# Patient Record
Sex: Female | Born: 1990 | Race: White | Hispanic: No | Marital: Married | Smoking: Never smoker
Health system: Southern US, Community
[De-identification: ages and names within clinical notes are randomized; demographics above are authoritative.]

## PROBLEM LIST (undated history)

## (undated) ENCOUNTER — Inpatient Hospital Stay (HOSPITAL_COMMUNITY): Payer: Self-pay

## (undated) ENCOUNTER — Inpatient Hospital Stay: Payer: Self-pay

## (undated) DIAGNOSIS — O169 Unspecified maternal hypertension, unspecified trimester: Secondary | ICD-10-CM

## (undated) DIAGNOSIS — D62 Acute posthemorrhagic anemia: Secondary | ICD-10-CM

## (undated) DIAGNOSIS — Z98891 History of uterine scar from previous surgery: Secondary | ICD-10-CM

## (undated) HISTORY — PX: WISDOM TOOTH EXTRACTION: SHX21

## (undated) HISTORY — PX: CHOLECYSTECTOMY: SHX55

---
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
## 2012-10-28 LAB — OB RESULTS CONSOLE HEPATITIS B SURFACE ANTIGEN: Hepatitis B Surface Ag: NEGATIVE

## 2012-10-28 LAB — OB RESULTS CONSOLE HGB/HCT, BLOOD: Hemoglobin: 13.8 g/dL

## 2012-10-28 LAB — OB RESULTS CONSOLE ABO/RH: RH Type: POSITIVE

## 2012-10-28 LAB — OB RESULTS CONSOLE HIV ANTIBODY (ROUTINE TESTING): HIV: NONREACTIVE

## 2012-10-28 LAB — OB RESULTS CONSOLE RUBELLA ANTIBODY, IGM: Rubella: IMMUNE

## 2013-01-20 LAB — OB RESULTS CONSOLE HGB/HCT, BLOOD: HCT: 37 %

## 2013-01-20 LAB — OB RESULTS CONSOLE RPR: RPR: NONREACTIVE

## 2013-03-13 ENCOUNTER — Inpatient Hospital Stay (HOSPITAL_COMMUNITY)
Admission: AD | Admit: 2013-03-13 | Discharge: 2013-03-13 | Disposition: A | Discharge status: Home / Self Care | Payer: Managed Care, Other (non HMO) | Source: Ambulatory Visit | Attending: Obstetrics & Gynecology | Admitting: Obstetrics & Gynecology

## 2013-03-13 ENCOUNTER — Encounter (HOSPITAL_COMMUNITY): Payer: Self-pay | Admitting: *Deleted

## 2013-03-13 DIAGNOSIS — R030 Elevated blood-pressure reading, without diagnosis of hypertension: Secondary | ICD-10-CM | POA: Insufficient documentation

## 2013-03-13 DIAGNOSIS — O99891 Other specified diseases and conditions complicating pregnancy: Secondary | ICD-10-CM | POA: Insufficient documentation

## 2013-03-13 DIAGNOSIS — O26893 Other specified pregnancy related conditions, third trimester: Secondary | ICD-10-CM

## 2013-03-13 DIAGNOSIS — R0989 Other specified symptoms and signs involving the circulatory and respiratory systems: Secondary | ICD-10-CM

## 2013-03-13 DIAGNOSIS — R51 Headache: Secondary | ICD-10-CM | POA: Insufficient documentation

## 2013-03-13 LAB — CBC
HCT: 35.6 % — ABNORMAL LOW (ref 36.0–46.0)
Hemoglobin: 11.8 g/dL — ABNORMAL LOW (ref 12.0–15.0)
MCH: 28.2 pg (ref 26.0–34.0)
MCHC: 33.1 g/dL (ref 30.0–36.0)
MCV: 85.2 fL (ref 78.0–100.0)
Platelets: 248 10*3/uL (ref 150–400)
RBC: 4.18 MIL/uL (ref 3.87–5.11)
RDW: 12.4 % (ref 11.5–15.5)
WBC: 10.6 10*3/uL — ABNORMAL HIGH (ref 4.0–10.5)

## 2013-03-13 LAB — URINALYSIS, ROUTINE W REFLEX MICROSCOPIC
Bilirubin Urine: NEGATIVE
Glucose, UA: NEGATIVE mg/dL
Hgb urine dipstick: NEGATIVE
Ketones, ur: NEGATIVE mg/dL
Leukocytes, UA: NEGATIVE
Nitrite: NEGATIVE
Protein, ur: NEGATIVE mg/dL
Specific Gravity, Urine: 1.025 (ref 1.005–1.030)
Urobilinogen, UA: 0.2 mg/dL (ref 0.0–1.0)
pH: 6.5 (ref 5.0–8.0)

## 2013-03-13 LAB — URIC ACID: Uric Acid, Serum: 4.8 mg/dL (ref 2.4–7.0)

## 2013-03-13 LAB — COMPREHENSIVE METABOLIC PANEL
ALT: 19 U/L (ref 0–35)
AST: 20 U/L (ref 0–37)
Albumin: 2.7 g/dL — ABNORMAL LOW (ref 3.5–5.2)
Alkaline Phosphatase: 181 U/L — ABNORMAL HIGH (ref 39–117)
BUN: 7 mg/dL (ref 6–23)
CO2: 24 mEq/L (ref 19–32)
Calcium: 9.1 mg/dL (ref 8.4–10.5)
Chloride: 103 mEq/L (ref 96–112)
Creatinine, Ser: 0.68 mg/dL (ref 0.50–1.10)
GFR calc Af Amer: >90 mL/min (ref >90)
GFR calc non Af Amer: >90 mL/min (ref >90)
Glucose, Bld: 83 mg/dL (ref 70–99)
Potassium: 4.4 mEq/L (ref 3.5–5.1)
Sodium: 135 mEq/L (ref 135–145)
Total Bilirubin: 0.2 mg/dL — ABNORMAL LOW (ref 0.3–1.2)
Total Protein: 6.4 g/dL (ref 6.0–8.3)

## 2013-03-13 MED ORDER — NALBUPHINE HCL 10 MG/ML IJ SOLN
5.0000 mg | INTRAMUSCULAR | Status: AC
  Administered 2013-03-13: 5 mg via SUBCUTANEOUS
  Filled 2013-03-13: qty 0.5

## 2013-03-13 NOTE — MAU Note (Signed)
Today had headache and went to [facility] and b/p was 155 95.

## 2013-03-13 NOTE — MAU Provider Note (Signed)
  History     CSN: [id number]  Arrival date and time: 03/13/13 2120 Orders placed on chart at 2125 Call from nurse @ 2205 Provider in to evaluate patient @ 2220   HPI  headache all day - unresponsive to Tylenol reports elevated BP 160/94 at pharmacy BP monitor persistent edema in legs for over a week - no worse but no better no vision changes no nausea or vomiting / no epigastric pain   No past medical history on file.  No past surgical history on file.  No family history on file.  History  Substance Use Topics  . Smoking status: Not on file  . Smokeless tobacco: Not on file  . Alcohol Use: Not on file    Allergies: Allergies not on file  No prescriptions prior to admission    ROS Physical Exam   VS: 98.4 - 94 - 20 - 132/84 Serial BPs: 132/84  130/79  129/93   126/83  Physical Exam  Alert and oriented x 3 Heart RRR Lungs clear Abdomen soft / non-tender / uterus gravid and non-tender Defer pelvic exam / VE Extremities mild dependent edema - trace / DTR 1+ no clonus  MAU Course  Procedures  NST - reactive  PIH labs: normal range  creatinine   0.68  BUN  Potassium  4.4  SGOT   20  SGPT    19  Alk Phos   181  uric acid  4.8  glucose 85    hgb  11.8  hct   35.6  plt    248   urine - negative protein / spec gravity @ 1.025  Assessment and Plan  34.6 weeks with headache Labile BP - mild hypertension c/w possible developing PIH Discussed risk for hypertensive disorders of pregnancy - adolescent /excessive weight gain /family HX No evidence PEC today - reviewed evaluation today does not rule out PEC prior to delivery - plan to continue to monitor closely with additional ROB visit this week   1) DC home 2) headache: nubain 5mg  subQ for headache and rest 3) dependent edema: elevate extremities / increase water / reduce sodium and fast foods 4) reviewed signs for PEC to call                    persistent headache / worsening swelling / blurred  vision / epigastric pain  Follow-up at [facility] this week for re-evaluation                    sono scheduled for 36 weeks (next visit in 2 weeks)     Kendra Hunter 03/13/2013, 9:33 PM

## 2013-03-16 LAB — OB RESULTS CONSOLE GBS: GBS: NEGATIVE

## 2013-03-29 ENCOUNTER — Encounter (HOSPITAL_COMMUNITY): Payer: Self-pay | Admitting: *Deleted

## 2013-03-29 ENCOUNTER — Inpatient Hospital Stay (HOSPITAL_COMMUNITY)
Admission: AD | Admit: 2013-03-29 | Discharge: 2013-04-02 | DRG: 765 | Disposition: A | Discharge status: Home / Self Care | Payer: Managed Care, Other (non HMO) | Source: Ambulatory Visit | Attending: Obstetrics & Gynecology | Admitting: Obstetrics & Gynecology

## 2013-03-29 DIAGNOSIS — O324XX0 Maternal care for high head at term, not applicable or unspecified: Secondary | ICD-10-CM | POA: Diagnosis present

## 2013-03-29 DIAGNOSIS — D62 Acute posthemorrhagic anemia: Secondary | ICD-10-CM

## 2013-03-29 DIAGNOSIS — O9903 Anemia complicating the puerperium: Secondary | ICD-10-CM | POA: Diagnosis not present

## 2013-03-29 DIAGNOSIS — O169 Unspecified maternal hypertension, unspecified trimester: Secondary | ICD-10-CM

## 2013-03-29 DIAGNOSIS — O139 Gestational [pregnancy-induced] hypertension without significant proteinuria, unspecified trimester: Principal | ICD-10-CM | POA: Diagnosis present

## 2013-03-29 DIAGNOSIS — Z98891 History of uterine scar from previous surgery: Secondary | ICD-10-CM

## 2013-03-29 HISTORY — DX: Unspecified maternal hypertension, unspecified trimester: O16.9

## 2013-03-29 HISTORY — DX: History of uterine scar from previous surgery: Z98.891

## 2013-03-29 HISTORY — DX: Acute posthemorrhagic anemia: D62

## 2013-03-29 LAB — CBC
HCT: 34 % — ABNORMAL LOW (ref 36.0–46.0)
Hemoglobin: 11.4 g/dL — ABNORMAL LOW (ref 12.0–15.0)
MCHC: 33.5 g/dL (ref 30.0–36.0)
MCV: 82.5 fL (ref 78.0–100.0)
Platelets: 251 10*3/uL (ref 150–400)
RBC: 4.12 MIL/uL (ref 3.87–5.11)
WBC: 10.6 10*3/uL — ABNORMAL HIGH (ref 4.0–10.5)

## 2013-03-29 LAB — COMPREHENSIVE METABOLIC PANEL
AST: 15 U/L (ref 0–37)
Albumin: 2.6 g/dL — ABNORMAL LOW (ref 3.5–5.2)
Alkaline Phosphatase: 185 U/L — ABNORMAL HIGH (ref 39–117)
CO2: 21 mEq/L (ref 19–32)
Calcium: 8.8 mg/dL (ref 8.4–10.5)
Creatinine, Ser: 0.63 mg/dL (ref 0.50–1.10)
GFR calc non Af Amer: >90 mL/min (ref >90)
Glucose, Bld: 90 mg/dL (ref 70–99)
Potassium: 4 mEq/L (ref 3.5–5.1)
Sodium: 136 mEq/L (ref 135–145)
Total Protein: 6.1 g/dL (ref 6.0–8.3)

## 2013-03-29 LAB — PROTEIN / CREATININE RATIO, URINE
Creatinine, Urine: 34.22 mg/dL
Protein Creatinine Ratio: 0.14 (ref 0.00–0.15)
Total Protein, Urine: 4.7 mg/dL

## 2013-03-29 LAB — TYPE AND SCREEN
ABO/RH(D): O POS
Antibody Screen: NEGATIVE

## 2013-03-29 LAB — LACTATE DEHYDROGENASE: LDH: 212 U/L (ref 94–250)

## 2013-03-29 LAB — RPR: RPR Ser Ql: NONREACTIVE

## 2013-03-29 MED ORDER — ACETAMINOPHEN 325 MG PO TABS: 650.0000 mg | ORAL_TABLET | ORAL | Status: DC | PRN

## 2013-03-29 MED ORDER — ZOLPIDEM TARTRATE 5 MG PO TABS
5.0000 mg | ORAL_TABLET | Freq: Every evening | ORAL | Status: DC | PRN
  Administered 2013-03-29: 5 mg via ORAL
  Filled 2013-03-29: qty 1

## 2013-03-29 MED ORDER — OXYTOCIN BOLUS FROM INFUSION: 500.0000 mL | INTRAVENOUS | Status: DC

## 2013-03-29 MED ORDER — LACTATED RINGERS IV SOLN: 500.0000 mL | Freq: Once | INTRAVENOUS | Status: DC

## 2013-03-29 MED ORDER — PHENYLEPHRINE 40 MCG/ML (10ML) SYRINGE FOR IV PUSH (FOR BLOOD PRESSURE SUPPORT): 80.0000 ug | PREFILLED_SYRINGE | INTRAVENOUS | Status: DC | PRN

## 2013-03-29 MED ORDER — PHENYLEPHRINE 40 MCG/ML (10ML) SYRINGE FOR IV PUSH (FOR BLOOD PRESSURE SUPPORT)
80.0000 ug | PREFILLED_SYRINGE | INTRAVENOUS | Status: DC | PRN
  Filled 2013-03-29: qty 10

## 2013-03-29 MED ORDER — LACTATED RINGERS IV SOLN
INTRAVENOUS | Status: DC
  Administered 2013-03-29 (×2): 125 mL/h via INTRAVENOUS
  Administered 2013-03-30 (×4): via INTRAVENOUS

## 2013-03-29 MED ORDER — OXYCODONE-ACETAMINOPHEN 5-325 MG PO TABS: 1.0000 | ORAL_TABLET | ORAL | Status: DC | PRN

## 2013-03-29 MED ORDER — CITRIC ACID-SODIUM CITRATE 334-500 MG/5ML PO SOLN
30.0000 mL | ORAL | Status: DC | PRN
  Administered 2013-03-30: 30 mL via ORAL
  Filled 2013-03-29: qty 15

## 2013-03-29 MED ORDER — TERBUTALINE SULFATE 1 MG/ML IJ SOLN: 0.2500 mg | Freq: Once | INTRAMUSCULAR | Status: AC | PRN

## 2013-03-29 MED ORDER — OXYTOCIN 40 UNITS IN LACTATED RINGERS INFUSION - SIMPLE MED: 62.5000 mL/h | INTRAVENOUS | Status: DC

## 2013-03-29 MED ORDER — LACTATED RINGERS IV SOLN
500.0000 mL | INTRAVENOUS | Status: DC | PRN
  Administered 2013-03-29: 300 mL via INTRAVENOUS
  Administered 2013-03-29: 500 mL via INTRAVENOUS

## 2013-03-29 MED ORDER — FENTANYL 2.5 MCG/ML BUPIVACAINE 1/10 % EPIDURAL INFUSION (WH - ANES)
14.0000 mL/h | INTRAMUSCULAR | Status: DC | PRN
  Administered 2013-03-30: 14 mL/h via EPIDURAL
  Filled 2013-03-29 (×2): qty 125

## 2013-03-29 MED ORDER — LIDOCAINE HCL (PF) 1 % IJ SOLN
30.0000 mL | INTRAMUSCULAR | Status: DC | PRN
  Filled 2013-03-29: qty 30

## 2013-03-29 MED ORDER — EPHEDRINE 5 MG/ML INJ: 10.0000 mg | INTRAVENOUS | Status: DC | PRN

## 2013-03-29 MED ORDER — ONDANSETRON HCL 4 MG/2ML IJ SOLN: 4.0000 mg | Freq: Four times a day (QID) | INTRAMUSCULAR | Status: DC | PRN

## 2013-03-29 MED ORDER — DIPHENHYDRAMINE HCL 50 MG/ML IJ SOLN: 12.5000 mg | INTRAMUSCULAR | Status: DC | PRN

## 2013-03-29 MED ORDER — MISOPROSTOL 25 MCG QUARTER TABLET
25.0000 ug | ORAL_TABLET | ORAL | Status: DC | PRN
  Administered 2013-03-29: 25 ug via VAGINAL
  Filled 2013-03-29: qty 0.25

## 2013-03-29 MED ORDER — IBUPROFEN 600 MG PO TABS: 600.0000 mg | ORAL_TABLET | Freq: Four times a day (QID) | ORAL | Status: DC | PRN

## 2013-03-29 MED ORDER — BUTORPHANOL TARTRATE 1 MG/ML IJ SOLN
1.0000 mg | INTRAMUSCULAR | Status: DC | PRN
  Administered 2013-03-29 – 2013-03-30 (×2): 1 mg via INTRAVENOUS
  Filled 2013-03-29 (×2): qty 1

## 2013-03-29 MED ORDER — EPHEDRINE 5 MG/ML INJ
10.0000 mg | INTRAVENOUS | Status: DC | PRN
  Filled 2013-03-29: qty 4

## 2013-03-29 NOTE — Progress Notes (Signed)
S: Doing well, no complaints.  Pt contracting q 1-3, notes discomfort in back, since cytotec placed. Would like something for pain. IOL for gest htn w/ HA. Pt notes HA now gone.  O: BP 127/57  Pulse 67  Temp(Src) 97.9 F (36.6 C) (Oral)  Resp 18  Ht 5\' 5"  (1.651 m)  Wt 100.699 kg (222 lb)  BMI 36.94 kg/m2 Gen: well appearing, no distress Abd: soft, gravid, NT, no RUQ pain LE: 2+ DTR, no clonus, 1+ edema   FHT:  FHR: 130's bpm, variability: moderate,  accelerations:  Present,  decelerations:  Absent UC:   regular, every 1 minutes SVE:   Dilation: 1.5 Effacement (%): 60 Station: -2 Exam by:: K.Forsell,RNC Cervical foley placed. SVE done, exam confirmed, vtx confirmed. Foley placement d/w pt and family. Foley bulb threaded through cvx, bulb inflated to 60 cc. Pt tolerated well. Within 5 minutes of placement, brisk vaginal bleeding noted, about 200 cc. Pt notes continued back cramping, no worse than prior to foley placement.    A / P:  22 y.o.  Obstetric History   G1   P0   T0   P0   A0   TAB0   SAB0   E0   M0   L0    at [redacted]w[redacted]d IOL due to gestational htn w/ HA, no lab or other exam evidence of PEC. - current vaginal bleeding. Likely due to foley placement. plts 200+. IV in place, T/S up to date. Will continue to monitor closely at this time. Given htn, abruption in DDx but abdomen soft, fetal testing reasurring.   Fetal Wellbeing:  Category I Pain Control:  will give 1mg  IV Stadol and ambien during cervical ripening  Anticipated MOD:  NSVD  Tiffinie Caillier A. 03/29/2013, 9:24 PM

## 2013-03-29 NOTE — H&P (Signed)
Kendra Hunter is a 22 y.o. female presenting for labor induction at 37 wks for worsening BPs (150/98, 130/98 in office) and HA in patient with GHTN that was noted at 33 wks.   Ob care at [facility], primary Ob Dr Juliene Pina, transferred care from [facility] at 15 wks. Nomal labs, Rh positive, passed glucose screen. Last growth sono on 11/3 7'1" at 88% and AC at 98%, nl AFI, VTX.   History OB History   Grav Para Term Preterm Abortions TAB SAB Ect Mult Living   1              History reviewed. No pertinent past medical history. Past Surgical History  Procedure Laterality Date  . Wisdom tooth extraction     Family History: family history is not on file. Social History:  reports that she has never smoked. She does not have any smokeless tobacco history on file. She reports that she does not drink alcohol or use illicit drugs.   Prenatal Transfer Tool  Maternal Diabetes: No Genetic Screening: Normal  QUAD scr neg, CF screen neg  Maternal Ultrasounds/Referrals: Normal Fetal Ultrasounds or other Referrals:  None Maternal Substance Abuse:  No Significant Maternal Medications:  None Significant Maternal Lab Results:  Lab values include: Group B Strep negative Other Comments:  Gestational HTN, PIH labs normal. FOB's sister has unilateral small kidney  Review of Systems  Constitutional: Negative for fever.  Respiratory: Negative for shortness of breath.   Cardiovascular: Negative for chest pain.  Skin: Negative for rash.  Neurological: Positive for headaches. Negative for dizziness.  Psychiatric/Behavioral: Negative for depression.    Dilation: 1 Effacement (%): 50 Station: -2 Exam by:: Renaldo Harrison, RN Blood pressure 143/84, pulse 78, temperature 99.1 F (37.3 C), temperature source Axillary, resp. rate 20, height 5\' 5"  (1.651 m), weight 222 lb (100.699 kg). Exam Physical Exam  A&O x 3, no acute distress. Pleasant HEENT neg Lungs CTA bilat CV RRR, S1S2 normal Abdo soft, non tender, non  acute Extr no edema/ tenderness.DTR +3/+3  Pelvic 1/50%/-3/VTX, intact. Pelvis gynecoid FHT 135 Toco none palpated  Prenatal labs: ABO, Rh: --/--/O POS, O POS (11/10 1210) Antibody: NEG (11/10 1210) Rubella: Immune (06/11 0000) RPR: Nonreactive (09/03 0000)  HBsAg: Negative (06/11 0000)  HIV: Non-reactive (06/11 0000)  GBS: Negative (10/28 0000)  Glucola - passed 3 hr GTT QUAD neg  PIH labs CF screening neg   Assessment/Plan: 22 yo, G1 at 37.1 wks with gestational HTN and worsening BPs with HAs. Admitted for labor induction. PIH labs, urine P/C ratio to decide on magnesium prophylaxis. IOL with Cytotec per protocol as tolerated. BP management as needed if persistently elevated. GBS neg. Plan reviewed with patient and her mother, 37 wk IOL due to elevated BP 158/98 in office.    Aravind Chrismer R 03/29/2013, 4:52 PM

## 2013-03-29 NOTE — Progress Notes (Signed)
CTSP for bleeding  Bleeding started w/ insertion of cervical foley. 200 cc EBL, balloon deflated to 30cc by RN. Bleeding stopped but pt just now up to bathroom and about 60 cc blood PV. Pt notes continued back pain, cramping. Has not yet received pain meds but would like something for pain.  O: BP 118/54  Pulse 79  Temp(Src) 98.7 F (37.1 C) (Oral)  Resp 18  Ht 5\' 5"  (1.651 m)  Wt 100.699 kg (222 lb)  BMI 36.94 kg/m2   FHT:  FHR: 140s bpm, variability: moderate,  accelerations:  Present,  decelerations:  Absent UC:   regular, every 1 minutes cvx 2.5 cm, foley pulled through cvx, small clot. No additional bleeding. 50%/ vtx -2   A / P:  22 y.o.  Obstetric History   G1   P0   T0   P0   A0   TAB0   SAB0   E0   M0   L0    at [redacted]w[redacted]d IOL for gestational htn Pt probably entering more active labor. Bleeding probably from cervical dilation. Reacitve fetal testing, stable maternal status, continue close observation. Expectant management. Stadol for pain. OK for Ambien to help sleep. If not having continued ctx and cervical change overnight will start pitocin and plan AROM.   Fetal Wellbeing:  Category I Pain Control:  Stadol now  Anticipated MOD:  NSVD  Kendra Pensyl A. 03/29/2013, 10:29 PM

## 2013-03-30 ENCOUNTER — Inpatient Hospital Stay (HOSPITAL_COMMUNITY): Payer: Managed Care, Other (non HMO) | Admitting: Anesthesiology

## 2013-03-30 ENCOUNTER — Encounter (HOSPITAL_COMMUNITY): Payer: Managed Care, Other (non HMO) | Admitting: Anesthesiology

## 2013-03-30 ENCOUNTER — Encounter (HOSPITAL_COMMUNITY)
Admission: AD | Disposition: A | Discharge status: Home / Self Care | Payer: Self-pay | Source: Ambulatory Visit | Attending: Obstetrics & Gynecology

## 2013-03-30 ENCOUNTER — Encounter (HOSPITAL_COMMUNITY): Payer: Self-pay | Admitting: Anesthesiology

## 2013-03-30 LAB — CBC
HCT: 31.5 % — ABNORMAL LOW (ref 36.0–46.0)
Hemoglobin: 10.6 g/dL — ABNORMAL LOW (ref 12.0–15.0)
MCH: 28 pg (ref 26.0–34.0)
MCV: 83.1 fL (ref 78.0–100.0)
Platelets: 212 10*3/uL (ref 150–400)
RBC: 3.79 MIL/uL — ABNORMAL LOW (ref 3.87–5.11)
WBC: 13.9 10*3/uL — ABNORMAL HIGH (ref 4.0–10.5)

## 2013-03-30 SURGERY — Surgical Case
Anesthesia: Epidural | Site: Abdomen | Wound class: Clean Contaminated

## 2013-03-30 MED ORDER — SODIUM BICARBONATE 8.4 % IV SOLN
INTRAVENOUS | Status: AC
  Filled 2013-03-30: qty 50

## 2013-03-30 MED ORDER — ONDANSETRON HCL 4 MG/2ML IJ SOLN
INTRAMUSCULAR | Status: DC | PRN
  Administered 2013-03-30: 4 mg via INTRAVENOUS

## 2013-03-30 MED ORDER — 0.9 % SODIUM CHLORIDE (POUR BTL) OPTIME
TOPICAL | Status: DC | PRN
  Administered 2013-03-30: 1000 mL

## 2013-03-30 MED ORDER — MORPHINE SULFATE (PF) 0.5 MG/ML IJ SOLN
INTRAMUSCULAR | Status: DC | PRN
  Administered 2013-03-30: 4 mg via EPIDURAL

## 2013-03-30 MED ORDER — FENTANYL CITRATE 0.05 MG/ML IJ SOLN
INTRAMUSCULAR | Status: AC
  Filled 2013-03-30: qty 2

## 2013-03-30 MED ORDER — MEPERIDINE HCL 25 MG/ML IJ SOLN
INTRAMUSCULAR | Status: AC
  Filled 2013-03-30: qty 1

## 2013-03-30 MED ORDER — OXYTOCIN 10 UNIT/ML IJ SOLN
INTRAMUSCULAR | Status: AC
  Filled 2013-03-30: qty 4

## 2013-03-30 MED ORDER — SODIUM BICARBONATE 8.4 % IV SOLN
INTRAVENOUS | Status: DC | PRN
  Administered 2013-03-30 (×5): 5 mL via EPIDURAL

## 2013-03-30 MED ORDER — PHENYLEPHRINE 40 MCG/ML (10ML) SYRINGE FOR IV PUSH (FOR BLOOD PRESSURE SUPPORT)
PREFILLED_SYRINGE | INTRAVENOUS | Status: AC
  Filled 2013-03-30: qty 5

## 2013-03-30 MED ORDER — MORPHINE SULFATE 0.5 MG/ML IJ SOLN
INTRAMUSCULAR | Status: AC
  Filled 2013-03-30: qty 10

## 2013-03-30 MED ORDER — CEFAZOLIN SODIUM-DEXTROSE 2-3 GM-% IV SOLR
INTRAVENOUS | Status: AC
  Filled 2013-03-30: qty 50

## 2013-03-30 MED ORDER — PHENYLEPHRINE HCL 10 MG/ML IJ SOLN
INTRAMUSCULAR | Status: DC | PRN
  Administered 2013-03-30: 80 ug via INTRAVENOUS

## 2013-03-30 MED ORDER — LIDOCAINE HCL (PF) 1 % IJ SOLN
INTRAMUSCULAR | Status: DC | PRN
  Administered 2013-03-30 (×2): 4 mL

## 2013-03-30 MED ORDER — MORPHINE SULFATE (PF) 0.5 MG/ML IJ SOLN
INTRAMUSCULAR | Status: DC | PRN
  Administered 2013-03-30: 1 mg via INTRAVENOUS

## 2013-03-30 MED ORDER — MEPERIDINE HCL 25 MG/ML IJ SOLN
INTRAMUSCULAR | Status: DC | PRN
  Administered 2013-03-30 (×2): 12.5 mg via INTRAVENOUS

## 2013-03-30 MED ORDER — CEFAZOLIN SODIUM-DEXTROSE 2-3 GM-% IV SOLR
2.0000 g | INTRAVENOUS | Status: AC
  Administered 2013-03-30: 2 g via INTRAVENOUS
  Filled 2013-03-30: qty 50

## 2013-03-30 MED ORDER — ONDANSETRON HCL 4 MG/2ML IJ SOLN
INTRAMUSCULAR | Status: AC
  Filled 2013-03-30: qty 2

## 2013-03-30 MED ORDER — FENTANYL 2.5 MCG/ML BUPIVACAINE 1/10 % EPIDURAL INFUSION (WH - ANES)
INTRAMUSCULAR | Status: DC | PRN
  Administered 2013-03-30: 14 mL/h via EPIDURAL

## 2013-03-30 MED ORDER — LIDOCAINE-EPINEPHRINE (PF) 2 %-1:200000 IJ SOLN
INTRAMUSCULAR | Status: AC
  Filled 2013-03-30: qty 20

## 2013-03-30 MED ORDER — OXYTOCIN 40 UNITS IN LACTATED RINGERS INFUSION - SIMPLE MED
1.0000 m[IU]/min | INTRAVENOUS | Status: DC
  Administered 2013-03-30: 2 m[IU]/min via INTRAVENOUS
  Filled 2013-03-30 (×2): qty 1000

## 2013-03-30 MED ORDER — OXYTOCIN 10 UNIT/ML IJ SOLN
40.0000 [IU] | INTRAVENOUS | Status: DC | PRN
  Administered 2013-03-30: 40 [IU] via INTRAVENOUS

## 2013-03-30 MED ORDER — TERBUTALINE SULFATE 1 MG/ML IJ SOLN: 0.2500 mg | Freq: Once | INTRAMUSCULAR | Status: AC | PRN

## 2013-03-30 MED ORDER — FENTANYL CITRATE 0.05 MG/ML IJ SOLN
INTRAMUSCULAR | Status: DC | PRN
  Administered 2013-03-30: 100 ug via EPIDURAL

## 2013-03-30 SURGICAL SUPPLY — 39 items
BARRIER ADHS 3X4 INTERCEED (GAUZE/BANDAGES/DRESSINGS) ×2 IMPLANT
BENZOIN TINCTURE PRP APPL 2/3 (GAUZE/BANDAGES/DRESSINGS) IMPLANT
CLAMP CORD UMBIL (MISCELLANEOUS) ×2 IMPLANT
CLOTH BEACON ORANGE TIMEOUT ST (SAFETY) ×2 IMPLANT
CONTAINER PREFILL 10% NBF 15ML (MISCELLANEOUS) IMPLANT
DRAPE LG THREE QUARTER DISP (DRAPES) ×2 IMPLANT
DRSG OPSITE POSTOP 4X10 (GAUZE/BANDAGES/DRESSINGS) ×2 IMPLANT
DURAPREP 26ML APPLICATOR (WOUND CARE) ×2 IMPLANT
ELECT REM PT RETURN 9FT ADLT (ELECTROSURGICAL) ×2
ELECTRODE REM PT RTRN 9FT ADLT (ELECTROSURGICAL) ×1 IMPLANT
EXTRACTOR VACUUM KIWI (MISCELLANEOUS) ×2 IMPLANT
EXTRACTOR VACUUM M CUP 4 TUBE (SUCTIONS) IMPLANT
GLOVE BIO SURGEON STRL SZ7 (GLOVE) ×2 IMPLANT
GLOVE BIOGEL PI IND STRL 7.0 (GLOVE) ×1 IMPLANT
GLOVE BIOGEL PI INDICATOR 7.0 (GLOVE) ×1
GOWN PREVENTION PLUS XLARGE (GOWN DISPOSABLE) ×8 IMPLANT
GOWN STRL REIN XL XLG (GOWN DISPOSABLE) ×4 IMPLANT
KIT ABG SYR 3ML LUER SLIP (SYRINGE) IMPLANT
NEEDLE HYPO 25X5/8 SAFETYGLIDE (NEEDLE) IMPLANT
NS IRRIG 1000ML POUR BTL (IV SOLUTION) ×2 IMPLANT
PACK C SECTION WH (CUSTOM PROCEDURE TRAY) ×2 IMPLANT
PAD OB MATERNITY 4.3X12.25 (PERSONAL CARE ITEMS) ×2 IMPLANT
RTRCTR C-SECT PINK 25CM LRG (MISCELLANEOUS) IMPLANT
STAPLER VISISTAT 35W (STAPLE) IMPLANT
STRIP CLOSURE SKIN 1/2X4 (GAUZE/BANDAGES/DRESSINGS) ×2 IMPLANT
STRIP CLOSURE SKIN 1/4X4 (GAUZE/BANDAGES/DRESSINGS) IMPLANT
SUT MON AB-0 CT1 36 (SUTURE) ×6 IMPLANT
SUT PLAIN 0 NONE (SUTURE) IMPLANT
SUT PLAIN 2 0 (SUTURE) ×1
SUT PLAIN ABS 2-0 CT1 27XMFL (SUTURE) ×1 IMPLANT
SUT VIC AB 0 CT1 27 (SUTURE) ×2
SUT VIC AB 0 CT1 27XBRD ANBCTR (SUTURE) ×2 IMPLANT
SUT VIC AB 2-0 CT1 27 (SUTURE) ×2
SUT VIC AB 2-0 CT1 TAPERPNT 27 (SUTURE) ×2 IMPLANT
SUT VIC AB 4-0 KS 27 (SUTURE) ×2 IMPLANT
SUT VICRYL 0 TIES 12 18 (SUTURE) IMPLANT
TOWEL OR 17X24 6PK STRL BLUE (TOWEL DISPOSABLE) ×2 IMPLANT
TRAY FOLEY CATH 14FR (SET/KITS/TRAYS/PACK) IMPLANT
WATER STERILE IRR 1000ML POUR (IV SOLUTION) IMPLANT

## 2013-03-30 NOTE — Progress Notes (Signed)
Patient ID: Kendra Hunter, female   DOB: 12/18/1990, 22 y.o.   MRN: [id number] Subjective: Complete since 8 pm and pushing over 1.1/2 hrs, c/o rectal and back pain and pressure.    Objective: BP 110/61  Pulse 92  Temp(Src) 98.6 F (37 C) (Oral)  Resp 18  Ht 5\' 5"  (1.651 m)  Wt 222 lb (100.699 kg)  BMI 36.94 kg/m2  SpO2 98%  FHT:  FHR: 130 bpm, variability: moderate,  accelerations:  Present,  decelerations:  Absent UC:   regular, every 3 minutes SVE:   Dilation: 10 Effacement (%): 100 Station: +2;+1 Exam by:: Lucas Mallow, RN Repeated exam by MD- Caput and moulding with no much progress in descent, at +1 with OP/OT position.   Assessment / Plan: Non progress or descent and rotation in OP/OT position at +1. Proceed with primary cesarean delivery.  Fetal Wellbeing:  Category I Pain Control:  Epidural Anticipated MOD:  Primary c-section.  Risks/complications of surgery reviewed incl infection, bleeding, damage to internal organs including bladder, bowels, ureters, blood vessels, other risks from anesthesia, VTE and delayed complications of any surgery, complications in future surgery reviewed. Also discussed neonatal complications incl difficult delivery, laceration, vacuum assistance, TTN etc. Pt understands and agrees, all concerns addressed.    Jeanne Diefendorf R 03/30/2013, 10:15 PM

## 2013-03-30 NOTE — Progress Notes (Signed)
S: Doing well, no complaints, pain worsening, awaiting epidural, still feeling contractions in back. Asks for AROM to be deferred until after epidural. No HA, No further bleeding. Slept off and on last night after Ambien and Stadol.   O: BP 105/61  Pulse 72  Temp(Src) 98.4 F (36.9 C) (Oral)  Resp 16  Ht 5\' 5"  (1.651 m)  Wt 100.699 kg (222 lb)  BMI 36.94 kg/m2  SpO2 99%   FHT:  FHR: 140s bpm, variability: moderate,  accelerations:  Present,  decelerations:  Present no deceleration UC:   regular, every 1 minutes SVE:   Dilation: 3 Effacement (%): 70 Station: -1 Exam by:: Areil Ottey  CBC    Component Value Date/Time   WBC 13.9* 03/30/2013 0730   RBC 3.79* 03/30/2013 0730   HGB 10.6* 03/30/2013 0730   HGB 12.7 01/20/2013   HCT 31.5* 03/30/2013 0730   HCT 37 01/20/2013   PLT 212 03/30/2013 0730   PLT 259 01/20/2013   MCV 83.1 03/30/2013 0730   MCH 28.0 03/30/2013 0730   MCHC 33.7 03/30/2013 0730   RDW 12.8 03/30/2013 0730       A / P:  22 y.o.  Obstetric History   G1   P0   T0   P0   A0   TAB0   SAB0   E0   M0   L0    at [redacted]w[redacted]d IOL for gest htn, no evidence PEC. continue induction, continue pitocin, AROM when able  Fetal Wellbeing:  Category I Pain Control:  Epidural  Anticipated MOD:  NSVD  Maximino Cozzolino A. 03/30/2013, 12:10 PM

## 2013-03-30 NOTE — Anesthesia Procedure Notes (Signed)
Epidural Patient location during procedure: OB Start time: 03/30/2013 11:37 AM  Staffing Anesthesiologist: Charlean Carneal A. Performed by: anesthesiologist   Preanesthetic Checklist Completed: patient identified, site marked, surgical consent, pre-op evaluation, timeout performed, IV checked, risks and benefits discussed and monitors and equipment checked  Epidural Patient position: sitting Prep: site prepped and draped and DuraPrep Patient monitoring: continuous pulse ox and blood pressure Approach: midline Injection technique: LOR air  Needle:  Needle type: Tuohy  Needle gauge: 17 G Needle length: 9 cm and 9 Needle insertion depth: 4 cm Catheter type: closed end flexible Catheter size: 19 Gauge Catheter at skin depth: 9 cm Test dose: negative and Other  Assessment Events: blood not aspirated, injection not painful, no injection resistance, negative IV test and no paresthesia  Additional Notes Patient identified. Risks and benefits discussed including failed block, incomplete  Pain control, post dural puncture headache, nerve damage, paralysis, blood pressure Changes, nausea, vomiting, reactions to medications-both toxic and allergic and post Partum back pain. All questions were answered. Patient expressed understanding and wished to proceed. Sterile technique was used throughout procedure. Epidural site was Dressed with sterile barrier dressing. No paresthesias, signs of intravascular injection Or signs of intrathecal spread were encountered.  Patient was more comfortable after the epidural was dosed. Please see RN's note for documentation of vital signs and FHR which are stable.

## 2013-03-30 NOTE — Progress Notes (Signed)
Kendra Hunter is a 22 y.o. G1P0 at [redacted]w[redacted]d by LMP c/w sono, admitted for worsening GHTN and headaches. S/p Cytotec, foley last night caused increased vaginal bleeding but has resolved with no further active bleeding.  Comfortable with epidural  Objective: BP 106/58  Pulse 90  Temp(Src) 98.4 F (36.9 C) (Oral)  Resp 16  Ht 5\' 5"  (1.651 m)  Wt 222 lb (100.699 kg)  BMI 36.94 kg/m2  SpO2 98%  BP stable, no antiHTN meds given since admission.   FHT:  FHR: 135 bpm, variability: moderate,  accelerations:  Present,  decelerations:  Absent UC:   regular, every 3 minutes SVE: 2-3/70%/-4/ VTX, controlled AROM, clear fluid but few  Small old clots noted.  Repeat CBC normal Assessment / Plan: Induction of labor due to gestational hypertension,  progressing well on pitocin. Pitocin per protocol and   Labor: Progressing normally, early labor, AROM now.  Preeclampsia:- none,  no signs or symptoms of toxicity, no clinical or lab evidence of PEC Fetal Wellbeing:  Category I Pain Control:  Epidural I/D:  n/a Anticipated MOD:  NSVD guarded since Vtx is high, but await progress.   Delorean Knutzen R 03/30/2013, 1:45 PM

## 2013-03-30 NOTE — Anesthesia Preprocedure Evaluation (Addendum)
Anesthesia Evaluation  Patient identified by MRN, date of birth, ID band Patient awake    Reviewed: Allergy & Precautions, H&P , Patient's Chart, lab work & pertinent test results  Airway Mallampati: III TM Distance: >3 FB Neck ROM: Full    Dental no notable dental hx. (+) Teeth Intact   Pulmonary neg pulmonary ROS,  breath sounds clear to auscultation  Pulmonary exam normal       Cardiovascular hypertension, Rhythm:Regular Rate:Normal  PIH   Neuro/Psych negative neurological ROS  negative psych ROS   GI/Hepatic Neg liver ROS, GERD-  Medicated and Controlled,  Endo/Other  Obesity  Renal/GU negative Renal ROS  negative genitourinary   Musculoskeletal negative musculoskeletal ROS (+)   Abdominal (+) + obese,   Peds  Hematology negative hematology ROS (+)   Anesthesia Other Findings   Reproductive/Obstetrics (+) Pregnancy (gestational HTN, failure to descend --> C/S) PIH                         Anesthesia Physical Anesthesia Plan  ASA: II and emergent  Anesthesia Plan: Epidural   Post-op Pain Management:    Induction:   Airway Management Planned: Natural Airway  Additional Equipment:   Intra-op Plan:   Post-operative Plan:   Informed Consent: I have reviewed the patients History and Physical, chart, labs and discussed the procedure including the risks, benefits and alternatives for the proposed anesthesia with the patient or authorized representative who has indicated his/her understanding and acceptance.     Plan Discussed with: Anesthesiologist, Surgeon and CRNA  Anesthesia Plan Comments:        Anesthesia Quick Evaluation

## 2013-03-30 NOTE — Progress Notes (Signed)
Patient ID: Kendra Hunter, female   DOB: [birth year], 22 y.o.   MRN: [id number] Subjective: Doing well, pain well controlled with Epidural. Denies HA today. NO SOB/CP.   Objective: BP 123/67  Pulse 67  Temp(Src) 98.7 F (37.1 C) (Oral)  Resp 16  Ht 5\' 5"  (1.651 m)  Wt 222 lb (100.699 kg)  BMI 36.94 kg/m2  SpO2 98%  FHT:  FHR: 130 bpm, variability: moderate,  accelerations:  Present,  decelerations:  Absent UC:   regular, every 3 minutes SVE:   Dilation: 3.5 Effacement (%): 70 Station: -3 Exam by:: Dr Juliene Pina Station feels high, at the inlet, caput noted.IUPC placed.   Assessment / Plan: Induction of labor due to gestational hypertension and with worsening BPs and Headache,  progressing well on pitocin  Fetal Wellbeing:  Category I Pain Control:  Epidural  Anticipated MOD:  anticipating C/section since Vx at pelvic inlet, but since pt and baby are stable, continue pitocin and reassess progress for active labor.   Clarece Drzewiecki R 03/30/2013, 5:14 PM

## 2013-03-31 ENCOUNTER — Encounter (HOSPITAL_COMMUNITY): Payer: Self-pay | Admitting: *Deleted

## 2013-03-31 DIAGNOSIS — D62 Acute posthemorrhagic anemia: Secondary | ICD-10-CM

## 2013-03-31 DIAGNOSIS — Z98891 History of uterine scar from previous surgery: Secondary | ICD-10-CM

## 2013-03-31 HISTORY — DX: Acute posthemorrhagic anemia: D62

## 2013-03-31 HISTORY — DX: History of uterine scar from previous surgery: Z98.891

## 2013-03-31 LAB — COMPREHENSIVE METABOLIC PANEL
ALT: 11 U/L (ref 0–35)
AST: 21 U/L (ref 0–37)
Alkaline Phosphatase: 142 U/L — ABNORMAL HIGH (ref 39–117)
Calcium: 8.3 mg/dL — ABNORMAL LOW (ref 8.4–10.5)
GFR calc Af Amer: >90 mL/min (ref >90)
Glucose, Bld: 94 mg/dL (ref 70–99)
Potassium: 4.2 mEq/L (ref 3.5–5.1)
Sodium: 136 mEq/L (ref 135–145)
Total Protein: 4.5 g/dL — ABNORMAL LOW (ref 6.0–8.3)

## 2013-03-31 LAB — CBC
HCT: 29.3 % — ABNORMAL LOW (ref 36.0–46.0)
HCT: 28.3 % — ABNORMAL LOW (ref 36.0–46.0)
Hemoglobin: 9.8 g/dL — ABNORMAL LOW (ref 12.0–15.0)
Hemoglobin: 9.4 g/dL — ABNORMAL LOW (ref 12.0–15.0)
MCH: 27.6 pg (ref 26.0–34.0)
MCHC: 33.4 g/dL (ref 30.0–36.0)
MCHC: 33.2 g/dL (ref 30.0–36.0)
MCV: 82.5 fL (ref 78.0–100.0)
Platelets: 201 10*3/uL (ref 150–400)
RBC: 3.55 MIL/uL — ABNORMAL LOW (ref 3.87–5.11)
RBC: 3.41 MIL/uL — ABNORMAL LOW (ref 3.87–5.11)
RDW: 12.8 % (ref 11.5–15.5)
WBC: 19.1 10*3/uL — ABNORMAL HIGH (ref 4.0–10.5)

## 2013-03-31 MED ORDER — IBUPROFEN 600 MG PO TABS
600.0000 mg | ORAL_TABLET | Freq: Four times a day (QID) | ORAL | Status: DC
  Administered 2013-03-31 – 2013-04-02 (×9): 600 mg via ORAL
  Filled 2013-03-31 (×9): qty 1

## 2013-03-31 MED ORDER — SIMETHICONE 80 MG PO CHEW: 80.0000 mg | CHEWABLE_TABLET | ORAL | Status: DC | PRN

## 2013-03-31 MED ORDER — MEPERIDINE HCL 25 MG/ML IJ SOLN: 6.2500 mg | INTRAMUSCULAR | Status: DC | PRN

## 2013-03-31 MED ORDER — DIPHENHYDRAMINE HCL 50 MG/ML IJ SOLN: 25.0000 mg | INTRAMUSCULAR | Status: DC | PRN

## 2013-03-31 MED ORDER — SENNOSIDES-DOCUSATE SODIUM 8.6-50 MG PO TABS
2.0000 | ORAL_TABLET | ORAL | Status: DC
  Administered 2013-04-01 (×2): 2 via ORAL
  Filled 2013-03-31 (×2): qty 2

## 2013-03-31 MED ORDER — OXYCODONE-ACETAMINOPHEN 5-325 MG PO TABS
1.0000 | ORAL_TABLET | ORAL | Status: DC | PRN
  Administered 2013-03-31: 1 via ORAL
  Filled 2013-03-31: qty 1

## 2013-03-31 MED ORDER — ONDANSETRON HCL 4 MG PO TABS: 4.0000 mg | ORAL_TABLET | ORAL | Status: DC | PRN

## 2013-03-31 MED ORDER — DIPHENHYDRAMINE HCL 25 MG PO CAPS: 25.0000 mg | ORAL_CAPSULE | ORAL | Status: DC | PRN

## 2013-03-31 MED ORDER — KETOROLAC TROMETHAMINE 60 MG/2ML IM SOLN: 60.0000 mg | Freq: Once | INTRAMUSCULAR | Status: DC | PRN

## 2013-03-31 MED ORDER — DIPHENHYDRAMINE HCL 25 MG PO CAPS: 25.0000 mg | ORAL_CAPSULE | Freq: Four times a day (QID) | ORAL | Status: DC | PRN

## 2013-03-31 MED ORDER — DIPHENHYDRAMINE HCL 50 MG/ML IJ SOLN: 12.5000 mg | INTRAMUSCULAR | Status: DC | PRN

## 2013-03-31 MED ORDER — METOCLOPRAMIDE HCL 5 MG/ML IJ SOLN: 10.0000 mg | Freq: Three times a day (TID) | INTRAMUSCULAR | Status: DC | PRN

## 2013-03-31 MED ORDER — TETANUS-DIPHTH-ACELL PERTUSSIS 5-2.5-18.5 LF-MCG/0.5 IM SUSP: 0.5000 mL | Freq: Once | INTRAMUSCULAR | Status: DC

## 2013-03-31 MED ORDER — PRENATAL MULTIVITAMIN CH
1.0000 | ORAL_TABLET | Freq: Every day | ORAL | Status: DC
  Administered 2013-03-31 – 2013-04-02 (×3): 1 via ORAL
  Filled 2013-03-31 (×3): qty 1

## 2013-03-31 MED ORDER — KETOROLAC TROMETHAMINE 30 MG/ML IJ SOLN: 30.0000 mg | Freq: Four times a day (QID) | INTRAMUSCULAR | Status: AC | PRN

## 2013-03-31 MED ORDER — NALOXONE HCL 1 MG/ML IJ SOLN
1.0000 ug/kg/h | INTRAVENOUS | Status: DC | PRN
  Filled 2013-03-31: qty 2

## 2013-03-31 MED ORDER — WITCH HAZEL-GLYCERIN EX PADS: 1.0000 "application " | MEDICATED_PAD | CUTANEOUS | Status: DC | PRN

## 2013-03-31 MED ORDER — DIBUCAINE 1 % RE OINT: 1.0000 "application " | TOPICAL_OINTMENT | RECTAL | Status: DC | PRN

## 2013-03-31 MED ORDER — SIMETHICONE 80 MG PO CHEW
80.0000 mg | CHEWABLE_TABLET | Freq: Three times a day (TID) | ORAL | Status: DC
  Administered 2013-03-31 – 2013-04-02 (×7): 80 mg via ORAL
  Filled 2013-03-31 (×6): qty 1

## 2013-03-31 MED ORDER — KETOROLAC TROMETHAMINE 30 MG/ML IJ SOLN
INTRAMUSCULAR | Status: AC
  Filled 2013-03-31: qty 1

## 2013-03-31 MED ORDER — METOCLOPRAMIDE HCL 5 MG/ML IJ SOLN: 10.0000 mg | Freq: Once | INTRAMUSCULAR | Status: DC | PRN

## 2013-03-31 MED ORDER — SCOPOLAMINE 1 MG/3DAYS TD PT72
1.0000 | MEDICATED_PATCH | Freq: Once | TRANSDERMAL | Status: DC
  Administered 2013-03-31: 1.5 mg via TRANSDERMAL

## 2013-03-31 MED ORDER — OXYTOCIN 40 UNITS IN LACTATED RINGERS INFUSION - SIMPLE MED: 62.5000 mL/h | INTRAVENOUS | Status: AC

## 2013-03-31 MED ORDER — ONDANSETRON HCL 4 MG/2ML IJ SOLN: 4.0000 mg | INTRAMUSCULAR | Status: DC | PRN

## 2013-03-31 MED ORDER — FENTANYL CITRATE 0.05 MG/ML IJ SOLN
25.0000 ug | INTRAMUSCULAR | Status: DC | PRN
  Administered 2013-03-31: 50 ug via INTRAVENOUS

## 2013-03-31 MED ORDER — ZOLPIDEM TARTRATE 5 MG PO TABS: 5.0000 mg | ORAL_TABLET | Freq: Every evening | ORAL | Status: DC | PRN

## 2013-03-31 MED ORDER — SODIUM CHLORIDE 0.9 % IJ SOLN: 3.0000 mL | INTRAMUSCULAR | Status: DC | PRN

## 2013-03-31 MED ORDER — SCOPOLAMINE 1 MG/3DAYS TD PT72
MEDICATED_PATCH | TRANSDERMAL | Status: AC
  Filled 2013-03-31: qty 1

## 2013-03-31 MED ORDER — NALBUPHINE SYRINGE 5 MG/0.5 ML
5.0000 mg | INJECTION | INTRAMUSCULAR | Status: DC | PRN
  Filled 2013-03-31: qty 1

## 2013-03-31 MED ORDER — KETOROLAC TROMETHAMINE 30 MG/ML IJ SOLN
30.0000 mg | Freq: Four times a day (QID) | INTRAMUSCULAR | Status: AC | PRN
  Administered 2013-03-31 (×2): 30 mg via INTRAVENOUS
  Filled 2013-03-31: qty 1

## 2013-03-31 MED ORDER — LACTATED RINGERS IV SOLN
INTRAVENOUS | Status: DC
  Administered 2013-03-31: 08:00:00 via INTRAVENOUS

## 2013-03-31 MED ORDER — MENTHOL 3 MG MT LOZG: 1.0000 | LOZENGE | OROMUCOSAL | Status: DC | PRN

## 2013-03-31 MED ORDER — LANOLIN HYDROUS EX OINT: 1.0000 "application " | TOPICAL_OINTMENT | CUTANEOUS | Status: DC | PRN

## 2013-03-31 MED ORDER — ONDANSETRON HCL 4 MG/2ML IJ SOLN: 4.0000 mg | Freq: Three times a day (TID) | INTRAMUSCULAR | Status: DC | PRN

## 2013-03-31 MED ORDER — SIMETHICONE 80 MG PO CHEW
80.0000 mg | CHEWABLE_TABLET | ORAL | Status: DC
  Administered 2013-04-01 – 2013-04-02 (×2): 80 mg via ORAL
  Filled 2013-03-31 (×2): qty 1

## 2013-03-31 MED ORDER — FENTANYL CITRATE 0.05 MG/ML IJ SOLN
INTRAMUSCULAR | Status: AC
  Filled 2013-03-31: qty 2

## 2013-03-31 MED ORDER — NALOXONE HCL 0.4 MG/ML IJ SOLN: 0.4000 mg | INTRAMUSCULAR | Status: DC | PRN

## 2013-03-31 NOTE — Op Note (Signed)
Procedure Note Margit Batte  03/30/2013  Indications: Dystocia, arrest of descent and rotation  Procedure: Primary Low Transverse Cesarean Section  Pre-operative Diagnosis: Failure to Descend, Gestational Hypertention. 37 wks.  Post-operative Diagnosis: Same   Surgeon: Robley Fries, MD   Assistants: Raelyn Mora, CNM  Anesthesia: epidural   Procedure Details:  The patient was seen in the labor room and arrest of descent and rotation noted. The risks, benefits, complications, treatment options, and expected outcomes were discussed with the patient. The patient concurred with the proposed plan, giving informed consent. identified as Autumn Pruitt and the procedure verified as C-Section Delivery. A Time Out was held and the above information confirmed. 2 gm Ancef given. After induction of anesthesia, the patient was draped and prepped in the usual sterile manner. A Pfannenstiel Incision was made and carried down through the subcutaneous tissue to the fascia. Fascial incision was made and extended transversely. The fascia was separated from the underlying rectus tissue superiorly and inferiorly. The peritoneum was identified and entered. Peritoneal incision was extended longitudinally. The utero-vesical peritoneal reflection was incised transversely and the bladder flap was bluntly freed from the lower uterine segment. CNM Arita Miss lifted the fetal head up into the lower uterine segment via vaginal exam and head was palpated under the lower segment. A low transverse uterine incision was made. Clear fluid noted. Delivered from cephalic presentation was a vigorous FEMALE infant at 11.21 pm with Apgar scores of 9 at one minute and 9 at five minutes. Cord clamped and cut and baby handed to NICU team in attendance. Cord ph was not sent, the umbilical cord was obtained for evaluation. The placenta was removed Intact and appeared normal and was sent to pathology. The uterine outline, tubes and ovaries  appeared normal. The uterine incision was closed with running locked sutures of in 2 layers. Hemostasis was observed. Interceed was placed. Peritoneum closed with 2-0 Vicryl. Muscles approximated in midline. The fascia was then reapproximated with running sutures of 0Vicryl. The subcuticular closure was performed using 2-0plain gut. The skin was closed with 4-0Vicryl. Steristrips and sterile dressing placed.  Instrument, sponge, and needle counts were correct prior the abdominal closure and were correct at the conclusion of the case.   Findings: Female infant delivered cephalic after lifting up with vaginal assistance from [facility] Hysterotomy at 23.21 hrs on 03/30/2013. No extensions noted. Normal placenta, uterus and tubes and ovaries. 3 vessel cord. Apgars 9 and 9. Weight pending.    Estimated Blood Loss: 800 cc   Total IV Fluids: 2500 ml   Urine Output: 100CC OF clear urine  Specimens: Cord blood and placenta  Complications: no complications  Disposition: PACU - hemodynamically stable.   Maternal Condition: stable   Baby condition / location:  Skin to skin with mother  Attending Attestation: I performed the procedure.   Signed: Surgeon(s): Robley Fries, MD

## 2013-03-31 NOTE — Anesthesia Postprocedure Evaluation (Signed)
Anesthesia Post Note  Patient: Kendra Hunter  Procedure(s) Performed: Procedure(s): Primary Cesarean Section Delivery Baby boy @ 2321, Apgars 9/9 (N/A)  Anesthesia type: Epidural  Patient location: Mother/Baby  Post pain: Pain level controlled  Post assessment: Post-op Vital signs reviewed  Last Vitals: BP 110/70  Pulse 89  Temp(Src) 36.8 C (Oral)  Resp 18  Ht 5\' 5"  (1.651 m)  Wt 222 lb (100.699 kg)  BMI 36.94 kg/m2  SpO2 97%  Post vital signs: Reviewed  Level of consciousness: awake  Complications: No apparent anesthesia complications

## 2013-03-31 NOTE — Anesthesia Postprocedure Evaluation (Signed)
  Anesthesia Post-op Note  Anesthesia Post Note  Patient: Kendra Hunter  Procedure(s) Performed: Procedure(s) (LRB): Primary Cesarean Section Delivery Baby boy @ 2321, Apgars 9/9 (N/A)  Anesthesia type: Epidural  Patient location: PACU  Post pain: Pain level controlled  Post assessment: Post-op Vital signs reviewed  Last Vitals:  Filed Vitals:   03/31/13 0017  BP:   Pulse:   Temp: 37.4 C  Resp:     Post vital signs: stable  Level of consciousness: awake  Complications: No apparent anesthesia complications

## 2013-03-31 NOTE — Progress Notes (Signed)
Patient ID: Kendra Hunter, female   DOB: April 27, 1991, 22 y.o.   MRN: [id number] POD # 1  S/P Primary C/S for failure to descend/gest HTN/ 37wks  Subjective: Pt reports feeling tired, "sleepy"/ Pain controlled with ibuprofen and intraop meds Tolerating po/ Foley patent, draining clear, amber urine/ No n/v/Flatus neg Activity: up with assistance Bleeding is light Newborn info:  Information for the patient's newborn:  Maleia, Weems [[id number]]  female  / circ unsure/ Feeding: breast   Objective: VS: Blood pressure 111/72, pulse 74, temperature 98.4 F (36.9 C), temperature source Oral, resp. rate 18.    BP range since OR: 102-142/66-80   Intake/Output Summary (Last 24 hours) at 03/31/13 0857 Last data filed at 03/31/13 0825  Gross per 24 hour  Intake   2820 ml  Output   1550 ml  Net   1270 ml      Recent Labs  03/31/13 0102 03/31/13 0605  WBC 19.1* 16.3*  HGB 9.8* 9.4*  HCT 29.3* 28.3*  PLT 201 184    Blood type: O POS,  Rubella: Immune     Physical Exam:  General: alert, cooperative and no distress CV: Regular rate and rhythm Resp: clear Abdomen: soft, nontender, BS faint, hypoactive Incision: Covered with Tegaderm and honeycomb dressing; well approximated.  Uterine Fundus: firm, below umbilicus, nontender Lochia: minimal Ext: SCD's in place    A/P: POD # 1/ G1P1001 S/P Primary C/Section d/t failure to descend, hx gest HTN Gest HTN stable ABL anemia, worsened by borderline chronic anemia; start iron supplement post op day 2 or 3 Doing well Continue routine post op orders   Signed: Demetrius Revel, MSN, [facility] 03/31/2013, 8:57 AM

## 2013-03-31 NOTE — Transfer of Care (Signed)
Immediate Anesthesia Transfer of Care Note  Patient: Kendra Hunter  Procedure(s) Performed: Procedure(s): Primary Cesarean Section Delivery Baby boy @ 2321, Apgars 9/9 (N/A)  Patient Location: PACU  Anesthesia Type:Epidural  Level of Consciousness: awake  Airway & Oxygen Therapy: Patient Spontanous Breathing  Post-op Assessment: Report given to PACU RN and Post -op Vital signs reviewed and stable  Post vital signs: stable  Complications: No apparent anesthesia complications

## 2013-04-01 ENCOUNTER — Encounter (HOSPITAL_COMMUNITY): Payer: Self-pay | Admitting: Obstetrics & Gynecology

## 2013-04-01 MED ORDER — POLYSACCHARIDE IRON COMPLEX 150 MG PO CAPS
150.0000 mg | ORAL_CAPSULE | Freq: Two times a day (BID) | ORAL | Status: DC
  Administered 2013-04-01 – 2013-04-02 (×3): 150 mg via ORAL
  Filled 2013-04-01 (×5): qty 1

## 2013-04-01 NOTE — Progress Notes (Signed)
POD # 2  Subjective: Pt reports feeling good/ Pain controlled with Motrin and Percocet Tolerating po/Voiding without problems/ No n/v/ Flatus present Activity: ad lib Bleeding is light Newborn info:  Information for the patient's newborn:  Daniella, Dewberry [[id number]]  female  / Circumcision: planning/ Feeding: breast   Objective: VS:  Filed Vitals:   03/31/13 2015 04/01/13 0010 04/01/13 0653 04/01/13 0823  BP: 133/65 118/67 146/73 110/66  Pulse: 78 79 94 77  Temp: 98.1 F (36.7 C) 98.2 F (36.8 C) 97.6 F (36.4 C) 98 F (36.7 C)  TempSrc: Oral Oral Oral Oral  Resp: 18 18 17 16   Height:      Weight:      SpO2: 96% 97% 98%     I&O: Intake/Output     11/12 0701 - 11/13 0700 11/13 0701 - 11/14 0700   P.O. 545    I.V. (mL/kg) 922.9 (9.2)    Total Intake(mL/kg) 1467.9 (14.6)    Urine (mL/kg/hr) 1100 (0.5)    Blood     Total Output 1100     Net +367.9            LABS:  Recent Labs  03/31/13 0102 03/31/13 0605  WBC 19.1* 16.3*  HGB 9.8* 9.4*  HCT 29.3* 28.3*  PLT 201 184    Blood type: --/--/O POS, O POS (11/10 1210) Rubella: Immune (06/11 0000)     Physical Exam:  General: alert and cooperative CV: Regular rate and rhythm Resp: CTA bilaterally Abdomen: soft, nontender, normal bowel sounds Uterine Fundus: firm, below umbilicus, nontender Incision: Covered with Tegaderm and honeycomb dressing; well approximated. No edema or erythema. Old drainage outlined without extension. Lochia: minimal Ext: extremities normal, atraumatic, no cyanosis or edema, Homan's negative   Assessment/: POD # 2/ G1P1001/ S/P C/Section d/t arrest of descent IDA with compounding ABL anemia Doing well  Plan: Continue routine post op orders Start Niferex Anticipate discharge home in the am   Signed: Donette Larry, N, MSN, CNM 04/01/2013, 9:41 AM

## 2013-04-02 MED ORDER — MAGNESIUM 250 MG PO TABS: 250.0000 mg | ORAL_TABLET | Freq: Every day | ORAL | Status: DC

## 2013-04-02 MED ORDER — IBUPROFEN 600 MG PO TABS: 600.0000 mg | ORAL_TABLET | Freq: Four times a day (QID) | ORAL | Status: DC

## 2013-04-02 MED ORDER — POLYSACCHARIDE IRON COMPLEX 150 MG PO CAPS: 150.0000 mg | ORAL_CAPSULE | Freq: Two times a day (BID) | ORAL | Status: DC

## 2013-04-02 MED ORDER — OXYCODONE-ACETAMINOPHEN 5-325 MG PO TABS: 1.0000 | ORAL_TABLET | ORAL | Status: DC | PRN

## 2013-04-02 NOTE — Progress Notes (Signed)
POSTOPERATIVE DAY # 3 S/P CS   S:         Reports feeling well - ready to go home             Tolerating po intake / no nausea / no vomiting / + flatus / no BM             Bleeding is light             Pain controlled with motrin and percocet             Up ad lib / ambulatory/ voiding QS  Newborn breast feeding    O:  VS: BP 115/70  Pulse 70  Temp(Src) 97.6 F (36.4 C) (Oral)  Resp 17  Ht 5\' 5"  (1.651 m)  Wt 100.699 kg (222 lb)  BMI 36.94 kg/m2  SpO2 100%   LABS:               Recent Labs  03/31/13 0102 03/31/13 0605  WBC 19.1* 16.3*  HGB 9.8* 9.4*  PLT 201 184               Bloodtype: --/--/O POS, O POS (11/10 1210)  Rubella: Immune (06/11 0000)                                   Physical Exam:             Alert and Oriented X3  Lungs: Clear and unlabored  Heart: regular rate and rhythm / no mumurs  Abdomen: soft, non-tender, non-distended              Fundus: firm, non-tender, U-1             Dressing intact honeycomb              Incision:  approximated with sutures / no erythema / no ecchymosis / no drainage  Perineum: intact  Lochia: light  Extremities: trace edema, no calf pain or tenderness, negative Homans  A:        POD # 3 S/P CS            IDA - stable status postop blood loss  P:        Routine postoperative care              Dc home - instructions reviewed - [facility] booklet    Marlinda Mike CNM, MSN, FACNM 04/02/2013, 9:50 AM

## 2013-04-02 NOTE — Discharge Summary (Signed)
POSTOPERATIVE DISCHARGE SUMMARY:  Patient ID: Kendra Hunter MRN: [id number] DOB/AGE: [birth year] 22 y.o.  Admit date: 03/29/2013 Admission Diagnoses: 37.2 weeks / worsening gestational hypertension  Discharge date:  04/02/2013 Discharge Diagnoses: POD 3 s/p cesarean section  Prenatal history: G1P1001   EDC : 04/18/2013, by Other Basis  Prenatal care at [facility]  Primary provider : Mody Prenatal course complicated by gestational hypertension / IDA of pregnancy  Prenatal Labs: ABO, Rh: --/--/O POS, O POS (11/10 1210)  Antibody: NEG (11/10 1210) Rubella: Immune (06/11 0000) RPR: NON REACTIVE (11/10 1210)  HBsAg: Negative (06/11 0000)  HIV: Non-reactive (06/11 0000)  GTT : nl GBS: Negative (10/28 0000)   Medical / Surgical History :  Past medical history:  Past Medical History  Diagnosis Date  . Hypertension in pregnancy 03/29/2013  . Postpartum care following cesarean delivery 03/31/2013  . S/P primary low transverse C-section 03/31/2013  . Acute blood loss anemia 03/31/2013    Past surgical history:  Past Surgical History  Procedure Laterality Date  . Wisdom tooth extraction    . Cesarean section N/A 03/30/2013    Procedure: Primary Cesarean Section Delivery Baby boy @ 2321, Apgars 9/9;  Surgeon: Robley Fries, MD;  Location: [facility];  Service: Obstetrics;  Laterality: N/A;    Family History: History reviewed. No pertinent family history.  Social History:  reports that she has never smoked. She does not have any smokeless tobacco history on file. She reports that she does not drink alcohol or use illicit drugs.  Allergies: Review of patient's allergies indicates no known allergies.    Intrapartum Course:  Admit for worsening gestational hypertension induction labor with labor progression to complete dilation with normal labor curve Arrest of descent at +1 ( caput to +2)  after ~ 2 hours of pushing Pain management: epidural Interventions  required: cesarean delivery  Procedures: Cesarean section delivery on 03/30/2013 with delivery of  female newborn by Dr Juliene Pina   See operative report for further details APGAR (1 MIN): 9   APGAR (5 MINS): 9    Postoperative / postpartum course:  Uncomplicated with discharge on POD 3 IDA compounded by ABL anemia - stable status at discharge  Physical Exam:   VSS: Temp:  [97.6 F (36.4 C)-98.7 F (37.1 C)] 97.6 F (36.4 C) (11/14 0610) Pulse Rate:  [70-92] 70 (11/14 0610) Resp:  [17] 17 (11/14 0610) BP: (115-129)/(70-85) 115/70 mmHg (11/14 0610) SpO2:  [98 %-100 %] 100 % (11/14 0610)  LABS:  Recent Labs  03/31/13 0102 03/31/13 0605  WBC 19.1* 16.3*  HGB 9.8* 9.4*  PLT 201 184    General: pleas nat / NAD / ambulatory Heart: RRR Lungs: clear  Abdomen: soft and non-tender / non-distended / active BS  Extremities: 1+ edema / negative Homans  Dressing: intact honeycomb dressing Incision:  approximated with sutures / no erythema / no ecchymosis / no drainage  Discharge Instructions:  Discharged Condition: stable  Activity: pelvic rest and postoperative restrictions x 2   Diet: routine  Medications:    Medication List    STOP taking these medications       calcium carbonate 500 MG chewable tablet  Commonly known as:  TUMS - dosed in mg elemental calcium      TAKE these medications       acetaminophen 325 MG tablet  Commonly known as:  TYLENOL  Take 325 mg by mouth every 6 (six) hours as needed for moderate pain.  ibuprofen 600 MG tablet  Commonly known as:  ADVIL,MOTRIN  Take 1 tablet (600 mg total) by mouth every 6 (six) hours.     iron polysaccharides 150 MG capsule  Commonly known as:  NIFEREX  Take 1 capsule (150 mg total) by mouth 2 (two) times daily.     Magnesium 250 MG Tabs  Take 1 tablet (250 mg total) by mouth daily.     oxyCODONE-acetaminophen 5-325 MG per tablet  Commonly known as:  PERCOCET/ROXICET  Take 1-2 tablets by mouth every 4  (four) hours as needed for severe pain (moderate - severe pain).     prenatal multivitamin Tabs tablet  Take 1 tablet by mouth daily at 12 noon.        Wound Care: keep clean and dry / remove honeycomb POD 5 Postpartum Instructions: [facility] discharge booklet - instructions reviewed  Discharge to: Home  Follow up :  [facility] in 1 weeks for interval visit for blood pressure recheck [facility] in 6 weeks for routine postpartum visit with Dr Juliene Pina                Signed: Marlinda Hunter CNM, MSN, [facility] 04/02/2013, 9:57 AM

## 2013-04-04 NOTE — Discharge Summary (Signed)
Reviewed and agree with note and plan. V.Delos Klich, MD  

## 2014-03-13 ENCOUNTER — Emergency Department: Payer: Self-pay | Admitting: Emergency Medicine

## 2014-03-13 LAB — CBC
HCT: 36.6 % (ref 35.0–47.0)
HGB: 11.9 g/dL — ABNORMAL LOW (ref 12.0–16.0)
MCH: 26.8 pg (ref 26.0–34.0)
MCHC: 32.5 g/dL (ref 32.0–36.0)
MCV: 83 fL (ref 80–100)
PLATELETS: 266 10*3/uL (ref 150–440)
RBC: 4.43 10*6/uL (ref 3.80–5.20)
RDW: 15 % — ABNORMAL HIGH (ref 11.5–14.5)
WBC: 9.5 10*3/uL (ref 3.6–11.0)

## 2014-03-14 LAB — HCG, QUANTITATIVE, PREGNANCY: Beta Hcg, Quant.: 2133 m[IU]/mL — ABNORMAL HIGH

## 2014-03-21 ENCOUNTER — Encounter (HOSPITAL_COMMUNITY): Payer: Self-pay | Admitting: Obstetrics & Gynecology

## 2014-08-02 ENCOUNTER — Ambulatory Visit: Payer: Self-pay | Admitting: Unknown Physician Specialty

## 2014-08-09 ENCOUNTER — Ambulatory Visit: Payer: Self-pay | Admitting: Surgery

## 2014-08-17 ENCOUNTER — Ambulatory Visit
Admit: 2014-08-17 | Disposition: A | Discharge status: Home / Self Care | Payer: Self-pay | Attending: Surgery | Admitting: Surgery

## 2014-09-12 LAB — SURGICAL PATHOLOGY

## 2014-09-18 NOTE — Op Note (Signed)
PATIENT NAME:  Kendra Hunter, Kendra Hunter MR#:  [id number] DATE OF BIRTH:  18-Aug-1990  DATE OF PROCEDURE:  08/17/2014  OPERATION PERFORMED: Laparoscopic cholecystectomy.   PREOPERATIVE DIAGNOSIS: Symptomatic cholelithiasis.   POSTOPERATIVE DIAGNOSIS: Symptomatic cholelithiasis.   SURGEON: Claude MangesWilliam F. Aryel Edelen, MD.   ANESTHESIA: General.   PROCEDURE IN DETAIL: The patient was placed supine on the operating room table and prepped and draped in the usual sterile fashion. A 15 mmHg CO2 pneumoperitoneum was created via a Veress needle in the infraumbilical position, and this was replaced with a 5 mm trocar and a 30 degree angled laparoscope. Remaining trocars were placed under direct visualization. The fundus of the gallbladder was retracted superiorly and ventrally and the infundibulum was retracted laterally and dissection within the triangle of Calot revealed 2 delicate structures both going into the gallbladder. One was the cystic duct and the other was the cystic artery. These were individually doubly clipped and divided and then the gallbladder was removed from the liver bed with the electrocautery, placed in an Endo Catch bag, and extracted from the abdomen via the epigastric port. This port site fascia was closed with a 0 Vicryl suture using the laparoscopic puncture closure device and the peritoneum was desufflated and decannulated after the right upper quadrant was irrigated with copious amounts of warm normal saline. The patient had multiple strange lobulations on the visceral surface of the right lobe of the liver that went in a horizontal orientation rather than typical fetal lobulation or segmental lobulation. After the peritoneum was decannulated all 4 skin sites were closed with subcuticular 5-0 Monocryl and suture strips. The patient tolerated the procedure well. There were no complications.    ____________________________ Claude MangesWilliam F. Ryaan Vanwagoner, MD wfm:bu D: 08/17/2014 12:01:52 ET T: 08/17/2014  18:28:34 ET JOB#: [id number]  cc: Claude MangesWilliam F. Claritza July, MD, <Dictator> Claude MangesWILLIAM F Dawanda Mapel MD ELECTRONICALLY SIGNED 08/18/2014 19:01

## 2015-05-21 NOTE — L&D Delivery Note (Signed)
Delivery Note At 8:44 AM a viable female sex was delivered via VBAC, Spontaneous (Presentation: ROA  ).  APGAR: 8, 9; weight pending.   Placenta status: spontaneous, intact.  Cord: 3VC  with the following complications: some increased bleeding post delivery received cytotec 800mcg rectally.  Ran cervix and no lacerations.  Cord pH: N/A  Anesthesia:  Epidural  Episiotomy:  none Lacerations:  none Est. Blood Loss (mL): 500mL    Mom to postpartum.  Baby to Couplet care / Skin to Skin.  Kendra Hunter M 02/18/2016, 8:51 AM

## 2016-01-19 ENCOUNTER — Encounter: Payer: Self-pay | Admitting: *Deleted

## 2016-01-19 ENCOUNTER — Observation Stay
Admission: EM | Admit: 2016-01-19 | Discharge: 2016-01-19 | Disposition: A | Discharge status: Home / Self Care | Payer: Medicaid Other | Attending: Obstetrics and Gynecology | Admitting: Obstetrics and Gynecology

## 2016-01-19 DIAGNOSIS — O163 Unspecified maternal hypertension, third trimester: Principal | ICD-10-CM | POA: Diagnosis present

## 2016-01-19 DIAGNOSIS — O133 Gestational [pregnancy-induced] hypertension without significant proteinuria, third trimester: Secondary | ICD-10-CM | POA: Diagnosis present

## 2016-01-19 DIAGNOSIS — Z3A00 Weeks of gestation of pregnancy not specified: Secondary | ICD-10-CM | POA: Diagnosis not present

## 2016-01-19 LAB — COMPREHENSIVE METABOLIC PANEL
ALBUMIN: 2.9 g/dL — AB (ref 3.5–5.0)
ALK PHOS: 180 U/L — AB (ref 38–126)
ALT: 24 U/L (ref 14–54)
ANION GAP: 7 (ref 5–15)
AST: 25 U/L (ref 15–41)
BILIRUBIN TOTAL: 0.6 mg/dL (ref 0.3–1.2)
BUN: 6 mg/dL (ref 6–20)
CALCIUM: 8.9 mg/dL (ref 8.9–10.3)
CO2: 21 mmol/L — ABNORMAL LOW (ref 22–32)
Chloride: 108 mmol/L (ref 101–111)
Creatinine, Ser: 0.57 mg/dL (ref 0.44–1.00)
GFR calc Af Amer: >60 mL/min (ref >60)
GFR calc non Af Amer: >60 mL/min (ref >60)
GLUCOSE: 83 mg/dL (ref 65–99)
Potassium: 4.5 mmol/L (ref 3.5–5.1)
Sodium: 136 mmol/L (ref 135–145)
TOTAL PROTEIN: 6.2 g/dL — AB (ref 6.5–8.1)

## 2016-01-19 LAB — CBC
HCT: 34 % — ABNORMAL LOW (ref 35.0–47.0)
Hemoglobin: 11.5 g/dL — ABNORMAL LOW (ref 12.0–16.0)
MCH: 28.5 pg (ref 26.0–34.0)
MCHC: 33.9 g/dL (ref 32.0–36.0)
MCV: 84.1 fL (ref 80.0–100.0)
Platelets: 233 10*3/uL (ref 150–440)
RBC: 4.04 MIL/uL (ref 3.80–5.20)
RDW: 12.6 % (ref 11.5–14.5)
WBC: 10.6 10*3/uL (ref 3.6–11.0)

## 2016-01-19 LAB — URINALYSIS COMPLETE WITH MICROSCOPIC (ARMC ONLY)
BILIRUBIN URINE: NEGATIVE
GLUCOSE, UA: NEGATIVE mg/dL
HGB URINE DIPSTICK: NEGATIVE
Ketones, ur: NEGATIVE mg/dL
Leukocytes, UA: NEGATIVE
NITRITE: NEGATIVE
Protein, ur: NEGATIVE mg/dL
RBC / HPF: NONE SEEN RBC/hpf (ref 0–5)
SPECIFIC GRAVITY, URINE: 1.005 (ref 1.005–1.030)
pH: 7 (ref 5.0–8.0)

## 2016-01-19 LAB — PROTEIN / CREATININE RATIO, URINE
CREATININE, URINE: 45 mg/dL
Total Protein, Urine: <6 mg/dL

## 2016-01-19 NOTE — Final Progress Note (Signed)
Physician Final Progress Note  Patient ID: Kendra Hunter MRN: [id number] DOB/AGE: [birth year] 25 y.o.  Admit date: 01/19/2016 Admitting provider: Vena AustriaAndreas Lekita Kerekes, MD Discharge date: 01/19/2016   Admission Diagnoses: Elevated BP in clinic  Discharge Diagnoses:  Active Problems:   Elevated blood pressure affecting pregnancy in third trimester, antepartum   Consults: None  Significant Findings/ Diagnostic Studies: Results for orders placed or performed during the hospital encounter of 01/19/16 (from the past 24 hour(s))  Protein / creatinine ratio, urine     Status: None   Collection Time: 01/19/16  4:45 PM  Result Value Ref Range   Creatinine, Urine 45 mg/dL   Total Protein, Urine <6 mg/dL   Protein Creatinine Ratio        0.00 - 0.15 mg/mg[Cre]  Urinalysis complete, with microscopic ([facility] only)     Status: Abnormal   Collection Time: 01/19/16  4:45 PM  Result Value Ref Range   Color, Urine YELLOW (A) YELLOW   APPearance CLEAR (A) CLEAR   Glucose, UA NEGATIVE NEGATIVE mg/dL   Bilirubin Urine NEGATIVE NEGATIVE   Ketones, ur NEGATIVE NEGATIVE mg/dL   Specific Gravity, Urine 1.005 1.005 - 1.030   Hgb urine dipstick NEGATIVE NEGATIVE   pH 7.0 5.0 - 8.0   Protein, ur NEGATIVE NEGATIVE mg/dL   Nitrite NEGATIVE NEGATIVE   Leukocytes, UA NEGATIVE NEGATIVE   RBC / HPF NONE SEEN 0 - 5 RBC/hpf   WBC, UA 0-5 0 - 5 WBC/hpf   Bacteria, UA RARE (A) NONE SEEN   Squamous Epithelial / LPF 6-30 (A) NONE SEEN  Comprehensive metabolic panel     Status: Abnormal   Collection Time: 01/19/16  5:28 PM  Result Value Ref Range   Sodium 136 135 - 145 mmol/L   Potassium 4.5 3.5 - 5.1 mmol/L   Chloride 108 101 - 111 mmol/L   CO2 21 (L) 22 - 32 mmol/L   Glucose, Bld 83 65 - 99 mg/dL   BUN 6 6 - 20 mg/dL   Creatinine, Ser 0.860.57 0.44 - 1.00 mg/dL   Calcium 8.9 8.9 - 57.810.3 mg/dL   Total Protein 6.2 (L) 6.5 - 8.1 g/dL   Albumin 2.9 (L) 3.5 - 5.0 g/dL   AST 25 15 - 41 U/L   ALT 24 14 - 54 U/L   Alkaline Phosphatase 180 (H) 38 - 126 U/L   Total Bilirubin 0.6 0.3 - 1.2 mg/dL   GFR calc non Af Amer >60 >60 mL/min   GFR calc Af Amer >60 >60 mL/min   Anion gap 7 5 - 15  CBC     Status: Abnormal   Collection Time: 01/19/16  5:28 PM  Result Value Ref Range   WBC 10.6 3.6 - 11.0 K/uL   RBC 4.04 3.80 - 5.20 MIL/uL   Hemoglobin 11.5 (L) 12.0 - 16.0 g/dL   HCT 46.934.0 (L) 62.935.0 - 52.847.0 %   MCV 84.1 80.0 - 100.0 fL   MCH 28.5 26.0 - 34.0 pg   MCHC 33.9 32.0 - 36.0 g/dL   RDW 41.312.6 24.411.5 - 01.014.5 %   Platelets 233 150 - 440 K/uL     Procedures: NST 130, moderate variability, +accles, no decels  Discharge Condition: good  Disposition: 01-Home or Self Care  Diet: Regular diet  Discharge Activity: Activity as tolerated  Discharge Instructions    Discharge activity:  No Restrictions    Complete by:  As directed   Discharge diet:  No restrictions    Complete by:  As directed   Fetal Kick Count:  Lie on our left side for one hour after a meal, and count the number of times your baby kicks.  If it is less than 5 times, get up, move around and drink some juice.  Repeat the test 30 minutes later.  If it is still less than 5 kicks in an hour, notify your doctor.    Complete by:  As directed   LABOR:  When conractions begin, you should start to time them from the beginning of one contraction to the beginning  of the next.  When contractions are 5 - 10 minutes apart or less and have been regular for at least an hour, you should call your health care provider.    Complete by:  As directed   No sexual activity restrictions    Complete by:  As directed   Notify physician for bleeding from the vagina    Complete by:  As directed   Notify physician for blurring of vision or spots before the eyes    Complete by:  As directed   Notify physician for chills or fever    Complete by:  As directed   Notify physician for fainting spells, "black outs" or loss of consciousness    Complete by:  As directed   Notify  physician for increase in vaginal discharge    Complete by:  As directed   Notify physician for leaking of fluid    Complete by:  As directed   Notify physician for pain or burning when urinating    Complete by:  As directed   Notify physician for pelvic pressure (sudden increase)    Complete by:  As directed   Notify physician for severe or continued nausea or vomiting    Complete by:  As directed   Notify physician for sudden gushing of fluid from the vagina (with or without continued leaking)    Complete by:  As directed   Notify physician for sudden, constant, or occasional abdominal pain    Complete by:  As directed   Notify physician if baby moving less than usual    Complete by:  As directed       Medication List    STOP taking these medications   ibuprofen 600 MG tablet Commonly known as:  ADVIL,MOTRIN   oxyCODONE-acetaminophen 5-325 MG tablet Commonly known as:  PERCOCET/ROXICET     TAKE these medications   acetaminophen 325 MG tablet Commonly known as:  TYLENOL Take 325 mg by mouth every 6 (six) hours as needed for moderate pain.   iron polysaccharides 150 MG capsule Commonly known as:  NIFEREX Take 1 capsule (150 mg total) by mouth 2 (two) times daily.   Magnesium 250 MG Tabs Take 1 tablet (250 mg total) by mouth daily.   prenatal multivitamin Tabs tablet Take 1 tablet by mouth daily at 12 noon.        Total time spent taking care of this patient: 20 minutes coordinating care, talking to patient  Signed: Lorrene Reid 01/19/2016, 6:12 PM

## 2016-01-19 NOTE — OB Triage Note (Signed)
G2P1 presents at 6215w3d w/ c/o high blood pressure and [facility] 142/91, 138/88. No leaking of fluid, vb, +fm.

## 2016-01-29 ENCOUNTER — Observation Stay
Admission: EM | Admit: 2016-01-29 | Discharge: 2016-01-29 | Disposition: A | Discharge status: Home / Self Care | Payer: Medicaid Other | Attending: Obstetrics & Gynecology | Admitting: Obstetrics & Gynecology

## 2016-01-29 DIAGNOSIS — Z3483 Encounter for supervision of other normal pregnancy, third trimester: Secondary | ICD-10-CM | POA: Diagnosis not present

## 2016-01-29 LAB — URINALYSIS COMPLETE WITH MICROSCOPIC (ARMC ONLY)
BILIRUBIN URINE: NEGATIVE
GLUCOSE, UA: NEGATIVE mg/dL
HGB URINE DIPSTICK: NEGATIVE
Ketones, ur: NEGATIVE mg/dL
Leukocytes, UA: NEGATIVE
Nitrite: NEGATIVE
Protein, ur: NEGATIVE mg/dL
SPECIFIC GRAVITY, URINE: 1.004 — AB (ref 1.005–1.030)
pH: 7 (ref 5.0–8.0)

## 2016-01-29 LAB — CBC
HEMATOCRIT: 33.7 % — AB (ref 35.0–47.0)
HEMOGLOBIN: 11.6 g/dL — AB (ref 12.0–16.0)
MCH: 28 pg (ref 26.0–34.0)
MCHC: 34.3 g/dL (ref 32.0–36.0)
MCV: 81.4 fL (ref 80.0–100.0)
Platelets: 221 10*3/uL (ref 150–440)
RBC: 4.14 MIL/uL (ref 3.80–5.20)
RDW: 12.7 % (ref 11.5–14.5)
WBC: 11.9 10*3/uL — AB (ref 3.6–11.0)

## 2016-01-29 LAB — BASIC METABOLIC PANEL
ANION GAP: 6 (ref 5–15)
BUN: <5 mg/dL — ABNORMAL LOW (ref 6–20)
CALCIUM: 8.6 mg/dL — AB (ref 8.9–10.3)
CHLORIDE: 105 mmol/L (ref 101–111)
CO2: 24 mmol/L (ref 22–32)
CREATININE: 0.61 mg/dL (ref 0.44–1.00)
GFR calc non Af Amer: >60 mL/min (ref >60)
Glucose, Bld: 81 mg/dL (ref 65–99)
Potassium: 4.2 mmol/L (ref 3.5–5.1)
SODIUM: 135 mmol/L (ref 135–145)

## 2016-01-29 LAB — PROTEIN / CREATININE RATIO, URINE: Creatinine, Urine: 45 mg/dL

## 2016-01-29 NOTE — Discharge Summary (Signed)
Physician Final Progress Note  Patient ID: Kendra Hunter MRN: [id number] DOB/AGE: 23-22-1992 23 y.o.  Admit date: 01/29/2016 Admitting provider: Rod Can, CNM Discharge date: 01/29/2016  Admission Diagnoses: G2P1 at 24w5dwith complaint of swelling and numbness in right arm and elevated blood pressure reading taken at her work. She has a history of preeclampsia with her first pregnancy  Discharge Diagnoses:  Active Problems:  Resolution of swelling and numbness, normal blood pressure, Reactive NST  Hospital Course: Pt was admitted for observation, placed on monitors, labs drawn/sent  Past Medical History:  Diagnosis Date  . Acute blood loss anemia 03/31/2013  . Hypertension in pregnancy 03/29/2013  . Postpartum care following cesarean delivery 03/31/2013  . S/P primary low transverse C-section 03/31/2013    Past Surgical History:  Procedure Laterality Date  . CESAREAN SECTION N/A 03/30/2013   Procedure: Primary Cesarean Section Delivery Baby boy @ 2321, Apgars 9/9;  Surgeon: VElveria Royals MD;  [facility];  Service: Obstetrics;  Laterality: N/A;  . CHOLECYSTECTOMY    . WISDOM TOOTH EXTRACTION      No current facility-administered medications on file prior to encounter.    Current Outpatient Prescriptions on File Prior to Encounter  Medication Sig Dispense Refill  . acetaminophen (TYLENOL) 325 MG tablet Take 325 mg by mouth every 6 (six) hours as needed for moderate pain.    . Prenatal Vit-Fe Fumarate-FA (PRENATAL MULTIVITAMIN) TABS tablet Take 1 tablet by mouth daily at 12 noon.      No Known Allergies  Social History   Social History  . Marital status: Single    Spouse name: N/A  . Number of children: N/A  . Years of education: N/A   Occupational History  . Not on file.   Social History Main Topics  . Smoking status: Never Smoker  . Smokeless tobacco: Never Used  . Alcohol use No  . Drug use: No  . Sexual activity: Yes    Birth control/  protection: None   Other Topics Concern  . Not on file   Social History Narrative  . No narrative on file    Physical Exam: BP 133/78   Pulse 92   Temp 98.7 F (37.1 C) (Oral)   Resp 16   Gen: NAD CV: RRR Pulm: CTAB Ext: no evidence of DVT Pelvic: deferred Toco: negative Fetal Well Being: 135 bpm, moderate variability, + accelerations, - decelerations Category I tracing   Consults: None  Significant Findings/ Diagnostic Studies: labs:   Results for JASHNA, DOROUGH(MRN [id number] as of 01/29/2016 17:13  Ref. Range 01/29/2016 15:15 01/29/2016 16:00  Sodium Latest Ref Range: 135 - 145 mmol/L  135  Potassium Latest Ref Range: 3.5 - 5.1 mmol/L  4.2  Chloride Latest Ref Range: 101 - 111 mmol/L  105  CO2 Latest Ref Range: 22 - 32 mmol/L  24  BUN Latest Ref Range: 6 - 20 mg/dL  <5 (L)  Creatinine Latest Ref Range: 0.44 - 1.00 mg/dL  0.61  Calcium Latest Ref Range: 8.9 - 10.3 mg/dL  8.6 (L)  EGFR (Non-African Amer.) Latest Ref Range: >60 mL/min  >60  EGFR (African American) Latest Ref Range: >60 mL/min  >60  Glucose Latest Ref Range: 65 - 99 mg/dL  81  Anion gap Latest Ref Range: 5 - 15   6  WBC Latest Ref Range: 3.6 - 11.0 K/uL  11.9 (H)  RBC Latest Ref Range: 3.80 - 5.20 MIL/uL  4.14  Hemoglobin Latest Ref Range: 12.0 -  16.0 g/dL  11.6 (L)  HCT Latest Ref Range: 35.0 - 47.0 %  33.7 (L)  MCV Latest Ref Range: 80.0 - 100.0 fL  81.4  MCH Latest Ref Range: 26.0 - 34.0 pg  28.0  MCHC Latest Ref Range: 32.0 - 36.0 g/dL  34.3  RDW Latest Ref Range: 11.5 - 14.5 %  12.7  Platelets Latest Ref Range: 150 - 440 K/uL  221  Appearance Latest Ref Range: CLEAR  CLEAR (A)   Bacteria, UA Latest Ref Range: NONE SEEN  RARE (A)   Bilirubin Urine Latest Ref Range: NEGATIVE  NEGATIVE   Color, Urine Latest Ref Range: YELLOW  YELLOW (A)   Glucose Latest Ref Range: NEGATIVE mg/dL NEGATIVE   Hgb urine dipstick Latest Ref Range: NEGATIVE  NEGATIVE   Ketones, ur Latest Ref Range: NEGATIVE mg/dL  NEGATIVE   Leukocytes, UA Latest Ref Range: NEGATIVE  NEGATIVE   Nitrite Latest Ref Range: NEGATIVE  NEGATIVE   pH Latest Ref Range: 5.0 - 8.0  7.0   Protein Latest Ref Range: NEGATIVE mg/dL NEGATIVE   RBC / HPF Latest Ref Range: 0 - 5 RBC/hpf 0-5   Specific Gravity, Urine Latest Ref Range: 1.005 - 1.030  1.004 (L)   Squamous Epithelial / LPF Latest Ref Range: NONE SEEN  0-5 (A)   WBC, UA Latest Ref Range: 0 - 5 WBC/hpf 0-5   Total Protein, Urine Latest Units: mg/dL <6   Protein Creatinine Ratio Latest Ref Range: 0.00 - 0.15 mg/mgCre Pend   Creatinine, Urine Latest Units: mg/dL 45     Procedures: none  Discharge Condition: good  Disposition: 01-Home or Self Care  Diet: Regular diet  Discharge Activity: Activity as tolerated  Discharge Instructions    Discharge activity:  No Restrictions    Complete by:  As directed   Discharge diet:  No restrictions    Complete by:  As directed   Fetal Kick Count:  Lie on our left side for one hour after a meal, and count the number of times your baby kicks.  If it is less than 5 times, get up, move around and drink some juice.  Repeat the test 30 minutes later.  If it is still less than 5 kicks in an hour, notify your doctor.    Complete by:  As directed   LABOR:  When conractions begin, you should start to time them from the beginning of one contraction to the beginning  of the next.  When contractions are 5 - 10 minutes apart or less and have been regular for at least an hour, you should call your health care provider.    Complete by:  As directed   No sexual activity restrictions    Complete by:  As directed   Notify physician for bleeding from the vagina    Complete by:  As directed   Notify physician for blurring of vision or spots before the eyes    Complete by:  As directed   Notify physician for chills or fever    Complete by:  As directed   Notify physician for fainting spells, "black outs" or loss of consciousness    Complete by:  As  directed   Notify physician for increase in vaginal discharge    Complete by:  As directed   Notify physician for leaking of fluid    Complete by:  As directed   Notify physician for pain or burning when urinating    Complete by:  As  directed   Notify physician for pelvic pressure (sudden increase)    Complete by:  As directed   Notify physician for severe or continued nausea or vomiting    Complete by:  As directed   Notify physician for sudden gushing of fluid from the vagina (with or without continued leaking)    Complete by:  As directed   Notify physician for sudden, constant, or occasional abdominal pain    Complete by:  As directed   Notify physician if baby moving less than usual    Complete by:  As directed       Medication List    TAKE these medications   acetaminophen 325 MG tablet Commonly known as:  TYLENOL Take 325 mg by mouth every 6 (six) hours as needed for moderate pain.   aspirin EC 81 MG tablet Take 81 mg by mouth daily.   prenatal multivitamin Tabs tablet Take 1 tablet by mouth daily at 12 noon.      Follow-up Information    [facility], [place] .   Why:  go to regular scheduled prenatal appointment Contact information: [address] [place] [address] [phone]           Total time spent taking care of this patient: 20 minutes  Signed: Rod Can, CNM  01/29/2016, 5:07 PM

## 2016-01-29 NOTE — Discharge Instructions (Signed)

## 2016-01-29 NOTE — OB Triage Note (Signed)
Ms. 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 Denies bleeding, LOF, reports headache yesterday that was relieved by tylenol. Denies visual changes, epigastric pain, has mild swelling in right arm, grips equal.

## 2016-02-16 ENCOUNTER — Observation Stay
Admission: EM | Admit: 2016-02-16 | Discharge: 2016-02-16 | Disposition: A | Discharge status: Home / Self Care | Payer: Medicaid Other | Attending: Obstetrics & Gynecology | Admitting: Obstetrics & Gynecology

## 2016-02-16 ENCOUNTER — Encounter: Payer: Self-pay | Admitting: *Deleted

## 2016-02-16 DIAGNOSIS — O163 Unspecified maternal hypertension, third trimester: Secondary | ICD-10-CM | POA: Diagnosis present

## 2016-02-16 DIAGNOSIS — Z3A39 39 weeks gestation of pregnancy: Secondary | ICD-10-CM | POA: Insufficient documentation

## 2016-02-16 DIAGNOSIS — Z9889 Other specified postprocedural states: Secondary | ICD-10-CM | POA: Diagnosis not present

## 2016-02-16 DIAGNOSIS — Z9049 Acquired absence of other specified parts of digestive tract: Secondary | ICD-10-CM | POA: Insufficient documentation

## 2016-02-16 DIAGNOSIS — O139 Gestational [pregnancy-induced] hypertension without significant proteinuria, unspecified trimester: Secondary | ICD-10-CM | POA: Diagnosis present

## 2016-02-16 DIAGNOSIS — O26893 Other specified pregnancy related conditions, third trimester: Secondary | ICD-10-CM | POA: Insufficient documentation

## 2016-02-16 DIAGNOSIS — R51 Headache: Secondary | ICD-10-CM | POA: Insufficient documentation

## 2016-02-16 DIAGNOSIS — O133 Gestational [pregnancy-induced] hypertension without significant proteinuria, third trimester: Principal | ICD-10-CM | POA: Insufficient documentation

## 2016-02-16 DIAGNOSIS — O169 Unspecified maternal hypertension, unspecified trimester: Secondary | ICD-10-CM | POA: Diagnosis present

## 2016-02-16 DIAGNOSIS — Z7982 Long term (current) use of aspirin: Secondary | ICD-10-CM | POA: Diagnosis not present

## 2016-02-16 LAB — CBC
HCT: 32.4 % — ABNORMAL LOW (ref 35.0–47.0)
Hemoglobin: 11 g/dL — ABNORMAL LOW (ref 12.0–16.0)
MCH: 27.4 pg (ref 26.0–34.0)
MCHC: 34 g/dL (ref 32.0–36.0)
MCV: 80.6 fL (ref 80.0–100.0)
PLATELETS: 218 10*3/uL (ref 150–440)
RBC: 4.02 MIL/uL (ref 3.80–5.20)
RDW: 13.7 % (ref 11.5–14.5)
WBC: 11.5 10*3/uL — AB (ref 3.6–11.0)

## 2016-02-16 LAB — COMPREHENSIVE METABOLIC PANEL
ALBUMIN: 2.8 g/dL — AB (ref 3.5–5.0)
ALT: 17 U/L (ref 14–54)
ANION GAP: 7 (ref 5–15)
AST: 18 U/L (ref 15–41)
Alkaline Phosphatase: 257 U/L — ABNORMAL HIGH (ref 38–126)
CHLORIDE: 109 mmol/L (ref 101–111)
CO2: 20 mmol/L — ABNORMAL LOW (ref 22–32)
Calcium: 8.6 mg/dL — ABNORMAL LOW (ref 8.9–10.3)
Creatinine, Ser: 0.59 mg/dL (ref 0.44–1.00)
GFR calc Af Amer: >60 mL/min (ref >60)
Glucose, Bld: 107 mg/dL — ABNORMAL HIGH (ref 65–99)
POTASSIUM: 3.6 mmol/L (ref 3.5–5.1)
Sodium: 136 mmol/L (ref 135–145)
Total Bilirubin: <0.1 mg/dL — ABNORMAL LOW (ref 0.3–1.2)
Total Protein: 6.4 g/dL — ABNORMAL LOW (ref 6.5–8.1)

## 2016-02-16 LAB — PROTEIN / CREATININE RATIO, URINE
CREATININE, URINE: 63 mg/dL
PROTEIN CREATININE RATIO: 0.16 mg/mg{creat} — AB (ref 0.00–0.15)
Total Protein, Urine: 10 mg/dL

## 2016-02-16 LAB — TYPE AND SCREEN
ABO/RH(D): O POS
Antibody Screen: NEGATIVE

## 2016-02-16 MED ORDER — ACETAMINOPHEN 325 MG PO TABS: 650.0000 mg | ORAL_TABLET | ORAL | Status: DC | PRN

## 2016-02-16 MED ORDER — ONDANSETRON HCL 4 MG/2ML IJ SOLN: 4.0000 mg | Freq: Four times a day (QID) | INTRAMUSCULAR | Status: DC | PRN

## 2016-02-16 NOTE — H&P (Signed)
Obstetrics Admission History & Physical   Hypertension   HPI:  25 y.o. G2P1001 @ 2741w2d (02/21/2016, by Other Basis). Admitted on 02/16/2016:   Patient Active Problem List   Diagnosis Date Noted  . Gestational hypertension 02/16/2016  . Labor and delivery indication for care or intervention 01/29/2016  . Elevated blood pressure affecting pregnancy in third trimester, antepartum 01/19/2016  . Postpartum care following cesarean delivery (03/30/13) 03/31/2013  . S/P primary low transverse C-section 03/31/2013  . Acute blood loss anemia 03/31/2013  . Hypertension in pregnancy 03/29/2013     Presents for monitoring due to elevated BP readings in office today.  Pt has had occas headache (relieved with Tylenol or rest) and spotty vision; edema over the past week in hands and feet; no RUQ pain or CP/SOB; Pt has prior h/o LTCS and desired TOLAC this pregnancy.  Pt had gest HTN w first pregnancy.  [facility]  PMHx:  Past Medical History:  Diagnosis Date  . Acute blood loss anemia 03/31/2013  . Hypertension in pregnancy 03/29/2013  . Postpartum care following cesarean delivery 03/31/2013  . S/P primary low transverse C-section 03/31/2013   PSHx:  Past Surgical History:  Procedure Laterality Date  . CESAREAN SECTION N/A 03/30/2013   Procedure: Primary Cesarean Section Delivery Baby boy @ 2321, Apgars 9/9;  Surgeon: Robley FriesVaishali R Mody, MD;  Location: [facility];  Service: Obstetrics;  Laterality: N/A;  . CHOLECYSTECTOMY    . WISDOM TOOTH EXTRACTION     Medications:  Prescriptions Prior to Admission  Medication Sig Dispense Refill Last Dose  . acetaminophen (TYLENOL) 325 MG tablet Take 325 mg by mouth every 6 (six) hours as needed for moderate pain.   02/16/2016  . aspirin EC 81 MG tablet Take 81 mg by mouth daily.   02/16/2016  . Prenatal Vit-Fe Fumarate-FA (PRENATAL MULTIVITAMIN) TABS tablet Take 1 tablet by mouth daily at 12 noon.   02/16/2016   Allergies: has No Known  Allergies. OBHx:  OB History  Gravida Para Term Preterm AB Living  2 1 1     1   SAB TAB Ectopic Multiple Live Births          1    # Outcome Date GA Lbr Len/2nd Weight Sex Delivery Anes PTL Lv  2 Current           1 Term 03/30/13 6723w2d 04:32 / 03:19 7 lb 7.6 oz (3.391 kg) M CS-LTranv EPI  LIV     ZOX:WRUEAVWU/[id number]:Negative/unremarkable except as detailed in HPI. Soc Hx: Never smoker, Alcohol: none, Recreational drug use: none, Denies domestic abuse and Pregnancy welcomed  Objective:  There were no vitals filed for this visit. General: Well nourished, well developed female in no acute distress.  Skin: Warm and dry.  Cardiovascular:Regular rate and rhythm. Respiratory: Clear to auscultation bilateral. Normal respiratory effort Abdomen: mild Neuro/Psych: Normal mood and affect.   Pelvic exam: is not limited by body habitus EGBUS: within normal limits Vagina: within normal limits and with normal mucosa blood in the vault Cervix: 2 cm per Dr Bonney AidStaebler Uterus: No contractions observed for 30 minutes.  Adnexa: not evaluated  EFM:FHR: 140 bpm, variability: moderate,  accelerations:  Present,  decelerations:  Absent Toco: None   Perinatal info:  Blood type: O positive Rubella- Immune Varicella -Not immune TDaP Given during third trimester of this pregnancy RPR NR / HIV Neg/ HBsAg Neg   Assessment & Plan:   25 y.o. G2P1001 @ 6641w2d, Admitted on 02/16/2016: Gest HTN,  monitor for preclampsia Option for delivery if significant HTN (with or wothout preeclampsia) discussed. TOLAC discussed, limitations in induction meds due to this.  Would utilize foley bulb and Pitocin. CS an option as well. If normal labs and stable BPs can monitor as outpatient and re-evaluate in 2-3 days.

## 2016-02-16 NOTE — Discharge Summary (Signed)
Patient discharged home, discharge instructions given, patient states understanding. Patient left floor in stable condition, denies any other needs at this time. Patient to schedule follow up appointment for monday

## 2016-02-16 NOTE — OB Triage Note (Signed)
Patient sent from office for elevated BP

## 2016-02-16 NOTE — Discharge Instructions (Signed)
Drink plenty of fluid and get plenty of rest. Call your provider for any other concerns °

## 2016-02-16 NOTE — Discharge Summary (Signed)
Physician Discharge Summary  Patient ID: Kendra Hunter MRN: [id number] DOB/AGE: February 21, 1991 25 y.o.  Admit date: 02/16/2016 Discharge date: 02/16/2016  Admission Diagnoses:  Discharge Diagnoses:  Active Problems:   Hypertension in pregnancy   Elevated blood pressure affecting pregnancy in third trimester, antepartum   Gestational hypertension  Discharged Condition: good  Hospital Course: See H&P: stable and normal BP here (120-130/70-80) and normal labs for preeclampsia.  No cuurent headache, blurry vision, epig pain.  Edema trace.  Consults: None  Significant Diagnostic Studies: A NST procedure was performed with FHR monitoring and a normal baseline established, appropriate time of 20-40 minutes of evaluation, and accels >2 seen w 15x15 characteristics.  Results show a REACTIVE NST.   Treatments: none  Disposition: 01-Home or Self Care F/u 2-3 days for BP check Warning signs for preeclampsia given.    Medication List    TAKE these medications   acetaminophen 325 MG tablet Commonly known as:  TYLENOL Take 325 mg by mouth every 6 (six) hours as needed for moderate pain.   aspirin EC 81 MG tablet Take 81 mg by mouth daily.   prenatal multivitamin Tabs tablet Take 1 tablet by mouth daily at 12 noon.        Signed: Letitia Libraobert Paul Montrelle Eddings 02/16/2016, 5:23 PM

## 2016-02-18 ENCOUNTER — Encounter: Payer: Self-pay | Admitting: *Deleted

## 2016-02-18 ENCOUNTER — Inpatient Hospital Stay: Payer: Medicaid Other | Admitting: Anesthesiology

## 2016-02-18 ENCOUNTER — Inpatient Hospital Stay
Admission: EM | Admit: 2016-02-18 | Discharge: 2016-02-19 | DRG: 775 | Disposition: A | Discharge status: Home / Self Care | Payer: Medicaid Other | Attending: Obstetrics and Gynecology | Admitting: Obstetrics and Gynecology

## 2016-02-18 DIAGNOSIS — D649 Anemia, unspecified: Secondary | ICD-10-CM | POA: Diagnosis not present

## 2016-02-18 DIAGNOSIS — Z3A39 39 weeks gestation of pregnancy: Secondary | ICD-10-CM

## 2016-02-18 DIAGNOSIS — O34211 Maternal care for low transverse scar from previous cesarean delivery: Secondary | ICD-10-CM | POA: Diagnosis present

## 2016-02-18 DIAGNOSIS — Z3483 Encounter for supervision of other normal pregnancy, third trimester: Secondary | ICD-10-CM | POA: Diagnosis present

## 2016-02-18 DIAGNOSIS — E669 Obesity, unspecified: Secondary | ICD-10-CM | POA: Diagnosis present

## 2016-02-18 DIAGNOSIS — Z6836 Body mass index (BMI) 36.0-36.9, adult: Secondary | ICD-10-CM

## 2016-02-18 DIAGNOSIS — O99214 Obesity complicating childbirth: Secondary | ICD-10-CM | POA: Diagnosis present

## 2016-02-18 DIAGNOSIS — O34219 Maternal care for unspecified type scar from previous cesarean delivery: Secondary | ICD-10-CM | POA: Diagnosis not present

## 2016-02-18 DIAGNOSIS — O9081 Anemia of the puerperium: Secondary | ICD-10-CM | POA: Diagnosis not present

## 2016-02-18 LAB — CBC
HEMATOCRIT: 36.9 % (ref 35.0–47.0)
HEMOGLOBIN: 11.9 g/dL — AB (ref 12.0–16.0)
MCH: 26.2 pg (ref 26.0–34.0)
MCHC: 32.3 g/dL (ref 32.0–36.0)
MCV: 81.2 fL (ref 80.0–100.0)
Platelets: 239 10*3/uL (ref 150–440)
RBC: 4.54 MIL/uL (ref 3.80–5.20)
RDW: 14 % (ref 11.5–14.5)
WBC: 14 10*3/uL — AB (ref 3.6–11.0)

## 2016-02-18 LAB — TYPE AND SCREEN
ABO/RH(D): O POS
ANTIBODY SCREEN: NEGATIVE

## 2016-02-18 MED ORDER — OXYTOCIN 40 UNITS IN LACTATED RINGERS INFUSION - SIMPLE MED: 2.5000 [IU]/h | INTRAVENOUS | Status: DC

## 2016-02-18 MED ORDER — MISOPROSTOL 200 MCG PO TABS
800.0000 ug | ORAL_TABLET | Freq: Once | ORAL | Status: AC
  Administered 2016-02-18: 09:00:00 via VAGINAL

## 2016-02-18 MED ORDER — TETANUS-DIPHTH-ACELL PERTUSSIS 5-2.5-18.5 LF-MCG/0.5 IM SUSP: 0.5000 mL | Freq: Once | INTRAMUSCULAR | Status: DC

## 2016-02-18 MED ORDER — DIPHENHYDRAMINE HCL 50 MG/ML IJ SOLN: 12.5000 mg | INTRAMUSCULAR | Status: DC | PRN

## 2016-02-18 MED ORDER — ACETAMINOPHEN 325 MG PO TABS
650.0000 mg | ORAL_TABLET | ORAL | Status: DC | PRN
Start: 2016-02-18 — End: 2016-02-19

## 2016-02-18 MED ORDER — IBUPROFEN 600 MG PO TABS
600.0000 mg | ORAL_TABLET | Freq: Four times a day (QID) | ORAL | Status: DC
  Administered 2016-02-18 – 2016-02-19 (×5): 600 mg via ORAL
  Filled 2016-02-18 (×4): qty 1

## 2016-02-18 MED ORDER — MISOPROSTOL 200 MCG PO TABS
ORAL_TABLET | ORAL | Status: AC
  Filled 2016-02-18: qty 4

## 2016-02-18 MED ORDER — DIPHENHYDRAMINE HCL 25 MG PO CAPS: 25.0000 mg | ORAL_CAPSULE | Freq: Four times a day (QID) | ORAL | Status: DC | PRN

## 2016-02-18 MED ORDER — ONDANSETRON HCL 4 MG/2ML IJ SOLN: 4.0000 mg | Freq: Four times a day (QID) | INTRAMUSCULAR | Status: DC | PRN

## 2016-02-18 MED ORDER — ACETAMINOPHEN 325 MG PO TABS: 650.0000 mg | ORAL_TABLET | Freq: Four times a day (QID) | ORAL | Status: DC

## 2016-02-18 MED ORDER — LIDOCAINE-EPINEPHRINE (PF) 1.5 %-1:200000 IJ SOLN
INTRAMUSCULAR | Status: DC | PRN
  Administered 2016-02-18: 3 mL

## 2016-02-18 MED ORDER — LIDOCAINE HCL (PF) 1 % IJ SOLN
INTRAMUSCULAR | Status: AC
  Filled 2016-02-18: qty 30

## 2016-02-18 MED ORDER — OXYTOCIN 40 UNITS IN LACTATED RINGERS INFUSION - SIMPLE MED
INTRAVENOUS | Status: AC
  Administered 2016-02-18: 500 mL via INTRAVENOUS
  Filled 2016-02-18: qty 1000

## 2016-02-18 MED ORDER — NALBUPHINE HCL 10 MG/ML IJ SOLN: 5.0000 mg | Freq: Once | INTRAMUSCULAR | Status: DC | PRN

## 2016-02-18 MED ORDER — ONDANSETRON HCL 4 MG PO TABS: 4.0000 mg | ORAL_TABLET | ORAL | Status: DC | PRN

## 2016-02-18 MED ORDER — BUTORPHANOL TARTRATE 1 MG/ML IJ SOLN: 1.0000 mg | INTRAMUSCULAR | Status: DC | PRN

## 2016-02-18 MED ORDER — LACTATED RINGERS IV SOLN
500.0000 mL | INTRAVENOUS | Status: DC | PRN
  Administered 2016-02-18: 500 mL via INTRAVENOUS

## 2016-02-18 MED ORDER — MEPERIDINE HCL 25 MG/ML IJ SOLN: 6.2500 mg | INTRAMUSCULAR | Status: DC | PRN

## 2016-02-18 MED ORDER — PRENATAL MULTIVITAMIN CH
1.0000 | ORAL_TABLET | Freq: Every morning | ORAL | Status: DC
  Administered 2016-02-18 – 2016-02-19 (×2): 1 via ORAL
  Filled 2016-02-18 (×3): qty 1

## 2016-02-18 MED ORDER — WITCH HAZEL-GLYCERIN EX PADS: 1.0000 "application " | MEDICATED_PAD | CUTANEOUS | Status: DC | PRN

## 2016-02-18 MED ORDER — LIDOCAINE HCL (PF) 1 % IJ SOLN: 30.0000 mL | INTRAMUSCULAR | Status: DC | PRN

## 2016-02-18 MED ORDER — ACETAMINOPHEN 325 MG PO TABS: 650.0000 mg | ORAL_TABLET | ORAL | Status: DC | PRN

## 2016-02-18 MED ORDER — FENTANYL 2.5 MCG/ML W/ROPIVACAINE 0.2% IN NS 100 ML EPIDURAL INFUSION (ARMC-ANES)
EPIDURAL | Status: AC
  Filled 2016-02-18: qty 100

## 2016-02-18 MED ORDER — OXYTOCIN 40 UNITS IN LACTATED RINGERS INFUSION - SIMPLE MED
INTRAVENOUS | Status: AC
  Administered 2016-02-18: 16:00:00
  Filled 2016-02-18: qty 1000

## 2016-02-18 MED ORDER — FENTANYL 2.5 MCG/ML W/ROPIVACAINE 0.2% IN NS 100 ML EPIDURAL INFUSION (ARMC-ANES): 10.0000 mL/h | EPIDURAL | Status: DC

## 2016-02-18 MED ORDER — OXYTOCIN 10 UNIT/ML IJ SOLN
INTRAMUSCULAR | Status: AC
  Filled 2016-02-18: qty 2

## 2016-02-18 MED ORDER — BENZOCAINE-MENTHOL 20-0.5 % EX AERO
1.0000 "application " | INHALATION_SPRAY | CUTANEOUS | Status: DC | PRN
  Administered 2016-02-18: 1 via TOPICAL
  Filled 2016-02-18: qty 56

## 2016-02-18 MED ORDER — KETOROLAC TROMETHAMINE 30 MG/ML IJ SOLN: 30.0000 mg | Freq: Four times a day (QID) | INTRAMUSCULAR | Status: DC | PRN

## 2016-02-18 MED ORDER — OXYCODONE-ACETAMINOPHEN 5-325 MG PO TABS: 2.0000 | ORAL_TABLET | ORAL | Status: DC | PRN

## 2016-02-18 MED ORDER — INFLUENZA VAC SPLIT QUAD 0.5 ML IM SUSY
0.5000 mL | PREFILLED_SYRINGE | INTRAMUSCULAR | Status: AC
Start: 2016-02-19 — End: 2016-02-19
  Administered 2016-02-19: 0.5 mL via INTRAMUSCULAR
  Filled 2016-02-18: qty 0.5

## 2016-02-18 MED ORDER — SENNOSIDES-DOCUSATE SODIUM 8.6-50 MG PO TABS
2.0000 | ORAL_TABLET | ORAL | Status: DC
  Administered 2016-02-19: 2 via ORAL
  Filled 2016-02-18: qty 2

## 2016-02-18 MED ORDER — FENTANYL 2.5 MCG/ML W/ROPIVACAINE 0.2% IN NS 100 ML EPIDURAL INFUSION (ARMC-ANES)
EPIDURAL | Status: DC | PRN
  Administered 2016-02-18: 10 mL/h via EPIDURAL

## 2016-02-18 MED ORDER — DIBUCAINE 1 % RE OINT: 1.0000 "application " | TOPICAL_OINTMENT | RECTAL | Status: DC | PRN

## 2016-02-18 MED ORDER — COCONUT OIL OIL
1.0000 "application " | TOPICAL_OIL | Status: DC | PRN
  Administered 2016-02-18: 1 via TOPICAL
  Filled 2016-02-18: qty 120

## 2016-02-18 MED ORDER — OXYTOCIN BOLUS FROM INFUSION
500.0000 mL | Freq: Once | INTRAVENOUS | Status: AC
  Administered 2016-02-18: 500 mL via INTRAVENOUS

## 2016-02-18 MED ORDER — SIMETHICONE 80 MG PO CHEW
80.0000 mg | CHEWABLE_TABLET | ORAL | Status: DC | PRN
Start: 2016-02-18 — End: 2016-02-19

## 2016-02-18 MED ORDER — AMMONIA AROMATIC IN INHA
RESPIRATORY_TRACT | Status: AC
  Filled 2016-02-18: qty 10

## 2016-02-18 MED ORDER — SODIUM CHLORIDE 0.9% FLUSH: 3.0000 mL | INTRAVENOUS | Status: DC | PRN

## 2016-02-18 MED ORDER — SODIUM CHLORIDE 0.9 % IV SOLN
INTRAVENOUS | Status: DC | PRN
  Administered 2016-02-18 (×3): 5 mL via EPIDURAL

## 2016-02-18 MED ORDER — NALBUPHINE HCL 10 MG/ML IJ SOLN: 5.0000 mg | INTRAMUSCULAR | Status: DC | PRN

## 2016-02-18 MED ORDER — SCOPOLAMINE 1 MG/3DAYS TD PT72: 1.0000 | MEDICATED_PATCH | Freq: Once | TRANSDERMAL | Status: DC

## 2016-02-18 MED ORDER — IBUPROFEN 600 MG PO TABS
ORAL_TABLET | ORAL | Status: AC
  Administered 2016-02-18: 600 mg via ORAL
  Filled 2016-02-18: qty 1

## 2016-02-18 MED ORDER — LACTATED RINGERS IV SOLN
INTRAVENOUS | Status: DC
  Administered 2016-02-18: 04:00:00 via INTRAVENOUS

## 2016-02-18 MED ORDER — DIPHENHYDRAMINE HCL 25 MG PO CAPS: 25.0000 mg | ORAL_CAPSULE | ORAL | Status: DC | PRN

## 2016-02-18 MED ORDER — ONDANSETRON HCL 4 MG/2ML IJ SOLN: 4.0000 mg | INTRAMUSCULAR | Status: DC | PRN

## 2016-02-18 MED ORDER — OXYCODONE-ACETAMINOPHEN 5-325 MG PO TABS: 1.0000 | ORAL_TABLET | ORAL | Status: DC | PRN

## 2016-02-18 MED ORDER — SOD CITRATE-CITRIC ACID 500-334 MG/5ML PO SOLN: 30.0000 mL | ORAL | Status: DC | PRN

## 2016-02-18 MED ORDER — BUTORPHANOL TARTRATE 1 MG/ML IJ SOLN
INTRAMUSCULAR | Status: AC
  Filled 2016-02-18: qty 1

## 2016-02-18 MED ORDER — NALOXONE HCL 2 MG/2ML IJ SOSY: 1.0000 ug/kg/h | PREFILLED_SYRINGE | INTRAMUSCULAR | Status: DC | PRN

## 2016-02-18 MED ORDER — NALOXONE HCL 0.4 MG/ML IJ SOLN: 0.4000 mg | INTRAMUSCULAR | Status: DC | PRN

## 2016-02-18 MED ORDER — ONDANSETRON HCL 4 MG/2ML IJ SOLN: 4.0000 mg | Freq: Three times a day (TID) | INTRAMUSCULAR | Status: DC | PRN

## 2016-02-18 NOTE — Anesthesia Procedure Notes (Signed)
Epidural Patient location during procedure: OB Start time: 02/18/2016 3:39 AM End time: 02/18/2016 4:09 AM  Staffing Anesthesiologist: Alver FisherPENWARDEN, Kendra Sposito Performed: anesthesiologist   Preanesthetic Checklist Completed: patient identified, site marked, surgical consent, pre-op evaluation, timeout performed, IV checked, risks and benefits discussed and monitors and equipment checked  Epidural Patient position: sitting Prep: ChloraPrep Patient monitoring: heart rate, continuous pulse ox and blood pressure Approach: midline Location: L4-L5 Injection technique: LOR saline  Needle:  Needle type: Tuohy  Needle gauge: 18 G Needle length: 9 cm and 9 Needle insertion depth: 6.5 cm Catheter type: closed end flexible Catheter size: 20 Guage Catheter at skin depth: 11 cm Test dose: negative (0.125% bupivacaine)  Assessment Events: blood not aspirated, injection not painful, no injection resistance, negative IV test and no paresthesia  Additional Notes   Patient tolerated the insertion well without complications.Reason for block:procedure for pain

## 2016-02-18 NOTE — OB Triage Note (Signed)
Patient states she has been hurting since yesterday afternoon in her abdomen and back that was every 6 minutes and was stabbing and is now constant cramping 6/10. Denies LOF, vag bleeding or any other concerns. States baby is moving well.

## 2016-02-18 NOTE — Discharge Summary (Signed)
Obstetric Discharge Summary Reason for Admission: onset of labor Prenatal Procedures: none Intrapartum Procedures: VBAC Postpartum Procedures: Varivax Complications-Operative and Postpartum:anemia Hemoglobin  Date Value Ref Range Status  02/18/2016 11.9 (L) 12.0 - 16.0 g/dL Final  16/10/960409/07/2012 54.012.7 g/dL Final   HGB  Date Value Ref Range Status  03/13/2014 11.9 (L) 12.0 - 16.0 g/dL Final   HCT  Date Value Ref Range Status  02/18/2016 36.9 35.0 - 47.0 % Final  03/13/2014 36.6 35.0 - 47.0 % Final  01/20/2013 37 % Final   O POS/ RI/ VNI/ GBS negative  Physical Exam: BP 122/73 (BP Location: Right Arm)   Pulse 64   Temp 98.1 F (36.7 C) (Oral)   Resp 18   Ht 5\' 5"  (1.651 m)   Wt 99.8 kg (220 lb)   SpO2 100%   Breastfeeding? Unknown   BMI 36.61 kg/m  General: alert, appears stated age and no distress/ pale Lochia: appropriate/ minimal Uterine Fundus: firm at U/ slightly deviated to right (needs to void)/ NT DVT Evaluation: No evidence of DVT seen on physical exam.  Discharge Diagnoses: Term Pregnancy-delivered  Discharge Information: Date: 02/18/2016 Activity: pelvic rest Diet: routine Medications: PNV, Ibuprofen and hemocyte Condition: stable Discharge to: home   Newborn Data: Live born female . Sophia Birth Weight:  8#2.9oz APGAR: 8, 9  Home with mother.  STAEBLER, ANDREAS M 02/18/2016, 9:00 AM

## 2016-02-18 NOTE — H&P (Signed)
Obstetric H&P   Chief Complaint: Contractions   Prenatal Care Provider: [facility]  History of Present Illness: 25 y.o. G2P1001 3059w4d by 02/21/2016, by LMP = 7 week US presenting to L&D with contractions.  No LOF, no VB.    PNC noteable for obesity, FOB with thin membrane disease, history gestational hypertension and prior C-section for 2nd stage arrest with fetus noted to be OP, 7lbs 7oz.    O pos / ABSC neg / RI / VZNI / HIV neg / HBsAg neg / RPR NR / 1-hr 82 / GBS negative  TDAP received 01/16/2016  Review of Systems: 10 point review of systems negative unless otherwise noted in HPI  Past Medical History: Past Medical History:  Diagnosis Date  . Acute blood loss anemia 03/31/2013  . Hypertension in pregnancy 03/29/2013  . Postpartum care following cesarean delivery 03/31/2013  . S/P primary low transverse C-section 03/31/2013    Past Surgical History: Past Surgical History:  Procedure Laterality Date  . CESAREAN SECTION N/A 03/30/2013   Procedure: Primary Cesarean Section Delivery Baby boy @ 2321, Apgars 9/9;  Surgeon: Robley FriesVaishali R Mody, MD;  Location: [facility];  Service: Obstetrics;  Laterality: N/A;  . CHOLECYSTECTOMY    . WISDOM TOOTH EXTRACTION     Family History: History reviewed. No pertinent family history.  Social History: Social History   Social History  . Marital status: Single    Spouse name: N/A  . Number of children: N/A  . Years of education: N/A   Occupational History  . Not on file.   Social History Main Topics  . Smoking status: Never Smoker  . Smokeless tobacco: Never Used  . Alcohol use No  . Drug use: No  . Sexual activity: Yes    Birth control/ protection: None   Other Topics Concern  . Not on file   Social History Narrative  . No narrative on file    Medications: Prior to Admission medications   Medication Sig Start Date End Date Taking? Authorizing Provider  acetaminophen (TYLENOL) 325 MG tablet Take 325 mg by mouth every 6 (six) hours  as needed for moderate pain.   Yes Historical Provider, MD  aspirin EC 81 MG tablet Take 81 mg by mouth daily.   Yes Historical Provider, MD  Prenatal Vit-Fe Fumarate-FA (PRENATAL MULTIVITAMIN) TABS tablet Take 1 tablet by mouth daily at 12 noon.   Yes Historical Provider, MD    Allergies: No Known Allergies  Physical Exam: Vitals: unknown if currently breastfeeding.  Urine Dip Protein: N/A  FHT: 120, moderate variability, +accels, no decels Toco: q2-533min  General: NAD HEENT: normocephalic, anicteric Pulmonary: no increased work of breathing Cardiovascular: RRR Abdomen: Gravid,  Non-tender Leopolds: vtx Genitourinary:  Dilation: 5.5 Effacement (%): 90 Cervical Position: Middle Station: -2, -1 Presentation: Vertex Exam by:: jac  Extremities: no edema  Labs: No results found for this or any previous visit (from the past 24 hour(s)).  Assessment: 25 y.o. G2P1001 2359w4d by 02/21/2016,   Plan: 1) Labor - TOLAC progressing spontaneously expectant management.  C-section in 2014 for 2nd stage arrest OP 7lbs 7oz fetus   2) Fetus - cat I tracing  3) PNL - O pos / ABSC neg / RI / VZNI / HIV neg / HBsAg neg / RPR NR / 1-hr 82 / GBS   4) TDAP - UTD  5) Disposition - pending delivery

## 2016-02-18 NOTE — Anesthesia Preprocedure Evaluation (Signed)
Anesthesia Evaluation  Patient identified by MRN, date of birth, ID band Patient awake    Reviewed: Allergy & Precautions, NPO status , Patient's Chart, lab work & pertinent test results  History of Anesthesia Complications Negative for: history of anesthetic complications  Airway Mallampati: II  TM Distance: >3 FB Neck ROM: Full    Dental no notable dental hx.    Pulmonary neg sleep apnea, neg COPD,    breath sounds clear to auscultation- rhonchi (-) wheezing      Cardiovascular hypertension, (-) CAD and (-) Past MI  Rhythm:Regular Rate:Normal - Systolic murmurs and - Diastolic murmurs    Neuro/Psych negative neurological ROS  negative psych ROS   GI/Hepatic negative GI ROS, Neg liver ROS,   Endo/Other  negative endocrine ROSneg diabetes  Renal/GU negative Renal ROS     Musculoskeletal   Abdominal (+) + obese, Gravid abdomen  Peds  Hematology  (+) anemia ,   Anesthesia Other Findings TOLAC presenting in spontaneous labor, prior c-section for arrest of descent  Reproductive/Obstetrics (+) Pregnancy                             Anesthesia Physical Anesthesia Plan  ASA: II  Anesthesia Plan: Epidural   Post-op Pain Management:    Induction:   Airway Management Planned:   Additional Equipment:   Intra-op Plan:   Post-operative Plan:   Informed Consent: I have reviewed the patients History and Physical, chart, labs and discussed the procedure including the risks, benefits and alternatives for the proposed anesthesia with the patient or authorized representative who has indicated his/her understanding and acceptance.     Plan Discussed with: Anesthesiologist  Anesthesia Plan Comments:         Lab Results  Component Value Date   WBC 14.0 (H) 02/18/2016   HGB 11.9 (L) 02/18/2016   HCT 36.9 02/18/2016   MCV 81.2 02/18/2016   PLT 239 02/18/2016    Anesthesia Quick  Evaluation

## 2016-02-19 DIAGNOSIS — O34219 Maternal care for unspecified type scar from previous cesarean delivery: Secondary | ICD-10-CM | POA: Diagnosis not present

## 2016-02-19 LAB — CBC
HEMATOCRIT: 30 % — AB (ref 35.0–47.0)
HEMOGLOBIN: 9.8 g/dL — AB (ref 12.0–16.0)
MCH: 26.3 pg (ref 26.0–34.0)
MCHC: 32.5 g/dL (ref 32.0–36.0)
MCV: 80.9 fL (ref 80.0–100.0)
Platelets: 203 10*3/uL (ref 150–440)
RBC: 3.71 MIL/uL — ABNORMAL LOW (ref 3.80–5.20)
RDW: 14 % (ref 11.5–14.5)
WBC: 13.6 10*3/uL — AB (ref 3.6–11.0)

## 2016-02-19 LAB — RPR: RPR Ser Ql: NONREACTIVE

## 2016-02-19 MED ORDER — IBUPROFEN 600 MG PO TABS: 600.0000 mg | ORAL_TABLET | Freq: Four times a day (QID) | ORAL | 0 refills | Status: DC | PRN

## 2016-02-19 MED ORDER — VARICELLA VIRUS VACCINE LIVE 1350 PFU/0.5ML IJ SUSR
0.5000 mL | Freq: Once | INTRAMUSCULAR | Status: AC
  Administered 2016-02-19: 0.5 mL via SUBCUTANEOUS
  Filled 2016-02-19 (×2): qty 0.5

## 2016-02-19 MED ORDER — FERROUS FUMARATE 324 (106 FE) MG PO TABS: 1.0000 | ORAL_TABLET | Freq: Every day | ORAL | 1 refills | Status: DC

## 2016-02-19 NOTE — Anesthesia Postprocedure Evaluation (Signed)
Anesthesia Post Note  Patient: Kendra Hunter  Procedure(s) Performed: * No procedures listed *  Patient location during evaluation: Mother Baby Anesthesia Type: Epidural Level of consciousness: awake and alert Pain management: pain level controlled Vital Signs Assessment: post-procedure vital signs reviewed and stable Respiratory status: spontaneous breathing Cardiovascular status: stable Postop Assessment: no headache, no signs of nausea or vomiting and adequate PO intake    Last Vitals:  Vitals:   02/19/16 0018 02/19/16 0357  BP: (!) 114/54 134/73  Pulse: 80 80  Resp:    Temp: 36.7 C 36.7 C    Last Pain:  Vitals:   02/19/16 0357  TempSrc: Oral  PainSc:                  Rica MastBachich,  Anwitha Mapes M

## 2016-02-19 NOTE — Discharge Instructions (Signed)
Please call your doctor or return to the ER if you experience any chest pains, shortness of breath, fever greater than 101, any heavy bleeding or large clots, and foul smelling vaginal discharge, any worsening abdominal pain & cramping that is not controlled by pain medication, or any signs of post partum depression.  No tampons, enemas, douches, or sexual intercourse for 6 weeks.  Also avoid tub baths, hot tubs, or swimming for 6 weeks.   Vaginal Delivery, Care After Refer to this sheet in the next few weeks. These discharge instructions provide you with information on caring for yourself after delivery. Your caregiver may also give you specific instructions. Your treatment has been planned according to the most current medical practices available, but problems sometimes occur. Call your caregiver if you have any problems or questions after you go home. HOME CARE INSTRUCTIONS 1. Take over-the-counter or prescription medicines only as directed by your caregiver or pharmacist. 2. Do not drink alcohol, especially if you are breastfeeding or taking medicine to relieve pain. 3. Do not smoke tobacco. 4. Continue to use good perineal care. Good perineal care includes: 1. Wiping your perineum from back to front 2. Keeping your perineum clean. 3. You can do sitz baths twice a day, to help keep this area clean 5. Do not use tampons, douche or have sex for 6 weeks. No strenuous exercise or heavy lifting x 6 weeks 6. Shower only and avoid sitting in submerged water, aside from sitz baths 7. Wear a well-fitting bra that provides breast support. 8. Eat healthy foods. 9. Drink enough fluids to keep your urine clear or pale yellow. 10. Eat high-fiber foods such as whole grain cereals and breads, brown rice, beans, and fresh fruits and vegetables every day. These foods may help prevent or relieve constipation. 11. Avoid constipation with high fiber foods or medications, such as miralax or metamucil 12. Follow  your caregiver's recommendations regarding resumption of activities such as climbing stairs, driving, lifting, exercising, or traveling. 13. Talk to your caregiver about resuming sexual activities. Resumption of sexual activities is dependent upon your risk of infection, your rate of healing, and your comfort and desire to resume sexual activity. 14. Try to have someone help you with your household activities and your newborn for at least a few days after you leave the hospital. 15. Rest as much as possible. Try to rest or take a nap when your newborn is sleeping. 16. Increase your activities gradually. 17. Keep all of your scheduled postpartum appointments. It is very important to keep your scheduled follow-up appointments. At these appointments, your caregiver will be checking to make sure that you are healing physically and emotionally. SEEK MEDICAL CARE IF:   You are passing large clots from your vagina. Save any clots to show your caregiver.  You have a foul smelling discharge from your vagina.  You have trouble urinating.  You are urinating frequently.  You have pain when you urinate.  You have a change in your bowel movements.  You have increasing redness, pain, or swelling near your vaginal incision (episiotomy) or vaginal tear.  You have pus draining from your episiotomy or vaginal tear.  Your episiotomy or vaginal tear is separating.  You have painful, hard, or reddened breasts.  You have a severe headache.  You have blurred vision or see spots.  You feel sad or depressed.  You have thoughts of hurting yourself or your newborn.  You have questions about your care, the care of your newborn,  or medicines.  You are dizzy or light-headed.  You have a rash.  You have nausea or vomiting.  You were breastfeeding and have not had a menstrual period within 12 weeks after you stopped breastfeeding.  You are not breastfeeding and have not had a menstrual period by the  12th week after delivery.  You have a fever of 100.5 or more SEEK IMMEDIATE MEDICAL CARE IF:   You have persistent pain.  You have chest pain.  You have shortness of breath.  You faint.  You have leg pain.  You have stomach pain.  Your vaginal bleeding saturates two or more sanitary pads in 1 hour. MAKE SURE YOU:   Understand these instructions.  Will watch your condition.  Will get help right away if you are not doing well or get worse. Document Released: 05/03/2000 Document Revised: 09/20/2013 Document Reviewed: 01/01/2012 [facility] Patient Information 2015 [place], [place]. This information is not intended to replace advice given to you by your health care provider. Make sure you discuss any questions you have with your health care provider.  Sitz Bath A sitz bath is a warm water bath taken in the sitting position. The water covers only the hips and butt (buttocks). We recommend using one that fits in the toilet, to help with ease of use and cleanliness. It may be used for either healing or cleaning purposes. Sitz baths are also used to relieve pain, itching, or muscle tightening (spasms). The water may contain medicine. Moist heat will help you heal and relax.  HOME CARE  Take 3 to 4 sitz baths a day. 18. Fill the bathtub half-full with warm water. 19. Sit in the water and open the drain a little. 20. Turn on the warm water to keep the tub half-full. Keep the water running constantly. 21. Soak in the water for 15 to 20 minutes. 22. After the sitz bath, pat the affected area dry. GET HELP RIGHT AWAY IF: You get worse instead of better. Stop the sitz baths if you get worse. MAKE SURE YOU:  Understand these instructions.  Will watch your condition.  Will get help right away if you are not doing well or get worse. Document Released: 06/13/2004 Document Revised: 01/29/2012 Document Reviewed: 09/03/2010 [facility] Patient Information 2015 [place], [place]. This information is  not intended to replace advice given to you by your health care provider. Make sure you discuss any questions you have with your health care provider.

## 2016-02-19 NOTE — Progress Notes (Signed)
D/C order from MD.  Reviewed d/c instructions and prescriptions with patient and answered any questions.  Patient d/c home with infant via wheelchair by nursing/auxillary. 

## 2016-07-22 ENCOUNTER — Ambulatory Visit (INDEPENDENT_AMBULATORY_CARE_PROVIDER_SITE_OTHER): Payer: BC Managed Care – PPO | Admitting: Obstetrics and Gynecology

## 2016-07-22 ENCOUNTER — Encounter: Payer: Self-pay | Admitting: Obstetrics and Gynecology

## 2016-07-22 VITALS — BP 124/66 | HR 74 | Ht 65.0 in | Wt 194.0 lb

## 2016-07-22 DIAGNOSIS — Z6832 Body mass index (BMI) 32.0-32.9, adult: Secondary | ICD-10-CM | POA: Diagnosis not present

## 2016-07-22 DIAGNOSIS — E669 Obesity, unspecified: Secondary | ICD-10-CM | POA: Diagnosis not present

## 2016-07-22 MED ORDER — PHENTERMINE HCL 37.5 MG PO CAPS: 37.5000 mg | ORAL_CAPSULE | Freq: Every day | ORAL | 0 refills | Status: DC

## 2016-07-22 NOTE — Progress Notes (Signed)
Gynecology Office Visit  Chief Complaint:  Chief Complaint  Patient presents with  . Weight Check    History of Present Illness: Patientis a 26 y.o. 732P2002 female, who presents for the evaluation of the desire to lose weight. She has lost 8 pounds, for a 2 month total weight loss of 14lbs. The patient states the following symptoms since starting her weight loss therapy: appetite suppression, energy, and weight loss.  The patient also reports no other ill effects. The patient specifically denies heart palpitations, anxiety, and insomnia.   Review of Systems: Review of Systems  Constitutional: Positive for weight loss.  Cardiovascular: Negative for chest pain and palpitations.  Neurological: Negative for dizziness.  Psychiatric/Behavioral: The patient is not nervous/anxious and does not have insomnia.     Past Medical History:  Past Medical History:  Diagnosis Date  . Acute blood loss anemia 03/31/2013  . Hypertension in pregnancy 03/29/2013  . Postpartum care following cesarean delivery 03/31/2013  . S/P primary low transverse C-section 03/31/2013    Past Surgical History:  Past Surgical History:  Procedure Laterality Date  . CESAREAN SECTION N/A 03/30/2013   Procedure: Primary Cesarean Section Delivery Baby boy @ 2321, Apgars 9/9;  Surgeon: Robley FriesVaishali R Mody, MD;  Location: [facility];  Service: Obstetrics;  Laterality: N/A;  . CHOLECYSTECTOMY    . WISDOM TOOTH EXTRACTION      Gynecologic History: No LMP recorded.  Obstetric History: [id number]  Family History:  No family history on file.  Social History:  Social History   Social History  . Marital status: Single    Spouse name: N/A  . Number of children: N/A  . Years of education: N/A   Occupational History  . Not on file.   Social History Main Topics  . Smoking status: Never Smoker  . Smokeless tobacco: Never Used  . Alcohol use No  . Drug use: No  . Sexual activity: Yes    Birth control/ protection: None    Other Topics Concern  . Not on file   Social History Narrative  . No narrative on file    Allergies:  No Known Allergies  Medications: Prior to Admission medications   Medication Sig Start Date End Date Taking? Authorizing Provider  phentermine 37.5 MG capsule Take 1 capsule (37.5 mg total) by mouth daily before breakfast. 07/22/16 08/21/16  Vena AustriaAndreas Tariya Morrissette, MD    Physical Exam Vitals:  Vitals:   07/22/16 1700  BP: 124/66  Pulse: 74  Body mass index is 32.28 kg/m.  No LMP recorded.  General: NAD HEENT: normocephalic, anicteric Thyroid: no enlargement Pulmonary: no increased work of breathing Neurologic: Grossly intact Psychiatric: mood appropriate, affect full  Assessment: 26 y.o. [id number] No problem-specific Assessment & Plan notes found for this encounter.   Plan: Problem List Items Addressed This Visit    None    Visit Diagnoses    Obesity (BMI 30-39.9)    -  Primary   Relevant Medications   phentermine 37.5 MG capsule   BMI 32.0-32.9,adult          1) 1800 Calorie ADA Diet  2) Patient education given regarding appropriate lifestyle changes for weight loss including: regular physical activity, healthy coping strategies, caloric restriction and healthy eating patterns.  3) Patient will be started on weight loss medication. The risks and benefits and side effects of medication, such as Adipex (Phenteramine) ,  Tenuate (Diethylproprion), Belviq (lorcarsin), Contrave (buproprion/naltrexone), Qsymia (phentermine/topiramate), and Saxenda (liraglutide) is discussed. The pros and  cons of suppressing appetite and boosting metabolism is discussed. Risks of tolerence and addiction is discussed for selected agents discussed. Use of medicine will ne short term, such as 3-4 months at a time followed by a period of time off of the medicine to avoid these risks and side effects for Adipex, Qsymia, and Tenuate discussed. Pt to call with any negative side effects and agrees to  keep follow up appts.  4) Patient to take medication, with the benefits of appetite suppression and metabolism boost d/w pt, along with the side effects and risk factors of long term use that will be avoided with our use of short bursts of therapy. Rx provided.    5) 15 minutes face-to-face; with counseling/coordination of care > 50 percent of visit related to obesity and ongoing management/treatment   6) Follow up in 4 weeks to assess response

## 2016-07-22 NOTE — Patient Instructions (Signed)

## 2016-08-20 ENCOUNTER — Encounter: Payer: Self-pay | Admitting: Obstetrics and Gynecology

## 2016-08-20 ENCOUNTER — Ambulatory Visit (INDEPENDENT_AMBULATORY_CARE_PROVIDER_SITE_OTHER): Payer: BC Managed Care – PPO | Admitting: Obstetrics and Gynecology

## 2016-08-20 VITALS — BP 122/66 | HR 87 | Ht 65.0 in | Wt 190.0 lb

## 2016-08-20 DIAGNOSIS — Z6831 Body mass index (BMI) 31.0-31.9, adult: Secondary | ICD-10-CM | POA: Diagnosis not present

## 2016-08-20 DIAGNOSIS — E669 Obesity, unspecified: Secondary | ICD-10-CM | POA: Diagnosis not present

## 2016-08-20 MED ORDER — PHENTERMINE HCL 37.5 MG PO CAPS: 37.5000 mg | ORAL_CAPSULE | Freq: Every day | ORAL | 0 refills | Status: DC

## 2016-08-20 NOTE — Progress Notes (Signed)
Gynecology Office Visit  Chief Complaint:  Chief Complaint  Patient presents with  . Weight Check    History of Present Illness: Patientis a 26 y.o. G77P2002 female, who presents for the evaluation of the desire to lose weight. She has lost 4lbs pounds for 3 month total weight loss of 18lbs. The patient states the following symptoms since starting her weight loss therapy: appetite suppression, energy, and weight loss.  The patient also reports no other ill effects. The patient specifically denies heart palpitations, anxiety, and insomnia.    Review of Systems: 10 point review of systems negative unless otherwise noted in HPI  Past Medical History:  Past Medical History:  Diagnosis Date  . Acute blood loss anemia 03/31/2013  . Hypertension in pregnancy 03/29/2013  . Postpartum care following cesarean delivery 03/31/2013  . S/P primary low transverse C-section 03/31/2013    Past Surgical History:  Past Surgical History:  Procedure Laterality Date  . CESAREAN SECTION N/A 03/30/2013   Procedure: Primary Cesarean Section Delivery Baby boy @ 2321, Apgars 9/9;  Surgeon: Robley Fries, MD;  Location: [facility];  Service: Obstetrics;  Laterality: N/A;  . CHOLECYSTECTOMY    . WISDOM TOOTH EXTRACTION      Gynecologic History: No LMP recorded. Patient is not currently having periods (Reason: IUD).  Obstetric History: [id number]  Family History:  No family history on file.  Social History:  Social History   Social History  . Marital status: Single    Spouse name: N/A  . Number of children: N/A  . Years of education: N/A   Occupational History  . Not on file.   Social History Main Topics  . Smoking status: Never Smoker  . Smokeless tobacco: Never Used  . Alcohol use No  . Drug use: No  . Sexual activity: Yes    Birth control/ protection: None   Other Topics Concern  . Not on file   Social History Narrative  . No narrative on file    Allergies:  No Known  Allergies  Medications: Prior to Admission medications   Medication Sig Start Date End Date Taking? Authorizing Provider  phentermine 37.5 MG capsule Take 1 capsule (37.5 mg total) by mouth daily before breakfast. 07/22/16 08/21/16  Vena Austria, MD    Physical Exam Vitals:  Vitals:   08/20/16 1627  BP: 122/66  Pulse: 87   No LMP recorded. Patient is not currently having periods (Reason: IUD). Body mass index is 31.62 kg/m. Filed Weights   08/20/16 1627  Weight: 190 lb (86.2 kg)    General: NAD HEENT: normocephalic, anicteric Thyroid: no enlargement Pulmonary: no increased work of breathing Neurologic: Grossly intact Psychiatric: mood appropriate, affect full  Assessment: 26 y.o. [id number] medical weight loss follow up  Plan: Problem List Items Addressed This Visit    None    Visit Diagnoses    Obesity (BMI 30.0-34.9)    -  Primary   Relevant Medications   phentermine 37.5 MG capsule   BMI 31.0-31.9,adult          1) 1500 Calorie ADA Diet  2) Patient education given regarding appropriate lifestyle changes for weight loss including: regular physical activity, healthy coping strategies, caloric restriction and healthy eating patterns.  3) Patient will be started on weight loss medication. The risks and benefits and side effects of medication, such as Adipex (Phenteramine) ,  Tenuate (Diethylproprion), Belviq (lorcarsin), Contrave (buproprion/naltrexone), Qsymia (phentermine/topiramate), and Saxenda (liraglutide) is discussed. The pros and cons of  suppressing appetite and boosting metabolism is discussed. Risks of tolerence and addiction is discussed for selected agents discussed. Use of medicine will ne short term, such as 3-4 months at a time followed by a period of time off of the medicine to avoid these risks and side effects for Adipex, Qsymia, and Tenuate discussed. Pt to call with any negative side effects and agrees to keep follow up appts.  4) Patient to take  medication, with the benefits of appetite suppression and metabolism boost d/w pt, along with the side effects and risk factors of long term use that will be avoided with our use of short bursts of therapy. Rx provided.    5) 15 minutes face-to-face; with counseling/coordination of care > 50 percent of visit related to obesity and ongoing management/treatment   6) Will do one additional month of phentermine then 3 months off, follow up in 4 months

## 2016-11-14 ENCOUNTER — Ambulatory Visit (INDEPENDENT_AMBULATORY_CARE_PROVIDER_SITE_OTHER): Payer: BC Managed Care – PPO | Admitting: Obstetrics and Gynecology

## 2016-11-14 ENCOUNTER — Encounter: Payer: Self-pay | Admitting: Obstetrics and Gynecology

## 2016-11-14 VITALS — BP 120/80 | HR 87 | Ht 65.0 in | Wt 191.0 lb

## 2016-11-14 DIAGNOSIS — R102 Pelvic and perineal pain: Secondary | ICD-10-CM | POA: Diagnosis not present

## 2016-11-14 DIAGNOSIS — Z30431 Encounter for routine checking of intrauterine contraceptive device: Secondary | ICD-10-CM

## 2016-11-14 NOTE — Progress Notes (Signed)
   Chief Complaint  Patient presents with  . iud check    HPI:      Kendra Hunter is a 26 y.o. [id number] who LMP was No LMP recorded. Patient is not currently having periods (Reason: IUD)., presents today for IUD check. Mirena placed 12/17 and pt has done really well. No periods, pelvic pain. Pt had an episode of light spotting for an hr a couple wks ago. Pt also had mild cramping and has had some cramping off and on since. She hasn't been able to feel the strings for the past 2 wks, and this is unusual for pt. No new sex partners, no dyspareunia, no vag sx, no urin sx.   Patient Active Problem List   Diagnosis Date Noted  . Vaginal birth after cesarean section 02/19/2016  . Postpartum care following vaginal delivery 03/31/2013  . Acute blood loss anemia 03/31/2013    No family history on file.  Social History   Social History  . Marital status: Single    Spouse name: N/A  . Number of children: N/A  . Years of education: N/A   Occupational History  . Not on file.   Social History Main Topics  . Smoking status: Never Smoker  . Smokeless tobacco: Never Used  . Alcohol use No  . Drug use: No  . Sexual activity: Yes    Birth control/ protection: None, IUD   Other Topics Concern  . Not on file   Social History Narrative  . No narrative on file     Current Outpatient Prescriptions:  .  levonorgestrel (MIRENA) 20 MCG/24HR IUD, 1 each by Intrauterine route once., Disp: , Rfl:   Review of Systems  Constitutional: Negative for fever.  Gastrointestinal: Negative for blood in stool, constipation, diarrhea, nausea and vomiting.  Genitourinary: Negative for dyspareunia, dysuria, flank pain, frequency, hematuria, urgency, vaginal bleeding, vaginal discharge and vaginal pain.  Musculoskeletal: Negative for back pain.  Skin: Negative for rash.     OBJECTIVE:   Vitals:  BP 120/80   Pulse 87   Ht 5\' 5"  (1.651 m)   Wt 191 lb (86.6 kg)   BMI 31.78 kg/m   Physical  Exam  Constitutional: She is oriented to person, place, and time and well-developed, well-nourished, and in no distress. Vital signs are normal.  Genitourinary: Vagina normal, uterus normal, cervix normal, right adnexa normal, left adnexa normal and vulva normal. Uterus is not enlarged. Cervix exhibits no motion tenderness and no tenderness. Right adnexum displays no mass and no tenderness. Left adnexum displays no mass and no tenderness. Vulva exhibits no erythema, no exudate, no lesion, no rash and no tenderness. Vagina exhibits no lesion.  Genitourinary Comments: IUD STRINGS IN CX OS  Neurological: She is oriented to person, place, and time.  Vitals reviewed.   Assessment/Plan: Encounter for routine checking of intrauterine contraceptive device (IUD) - IUD in place. Reassurance.  Pelvic cramping - Reassurance.    Meds ordered this encounter  Medications  . levonorgestrel (MIRENA) 20 MCG/24HR IUD    Sig: 1 each by Intrauterine route once.      Return if symptoms worsen or fail to improve.  Alicia B. Copland, PA-C 11/14/2016 11:40 AM

## 2016-12-24 ENCOUNTER — Ambulatory Visit: Payer: BC Managed Care – PPO | Admitting: Obstetrics and Gynecology

## 2016-12-25 ENCOUNTER — Ambulatory Visit: Payer: BC Managed Care – PPO | Admitting: Obstetrics and Gynecology

## 2017-01-10 ENCOUNTER — Ambulatory Visit (INDEPENDENT_AMBULATORY_CARE_PROVIDER_SITE_OTHER): Payer: BC Managed Care – PPO | Admitting: Obstetrics and Gynecology

## 2017-01-10 ENCOUNTER — Encounter: Payer: Self-pay | Admitting: Obstetrics and Gynecology

## 2017-01-10 VITALS — BP 130/80 | HR 88 | Ht 64.0 in | Wt 200.0 lb

## 2017-01-10 DIAGNOSIS — E669 Obesity, unspecified: Secondary | ICD-10-CM | POA: Diagnosis not present

## 2017-01-10 DIAGNOSIS — Z6834 Body mass index (BMI) 34.0-34.9, adult: Secondary | ICD-10-CM

## 2017-01-10 MED ORDER — PHENTERMINE HCL 37.5 MG PO TABS: 37.5000 mg | ORAL_TABLET | Freq: Every day | ORAL | 0 refills | Status: DC

## 2017-01-10 NOTE — Progress Notes (Signed)
Gynecology Office Visit  Chief Complaint:  Chief Complaint  Patient presents with  . Weight Check    History of Present Illness: Patientis a 26 y.o. G20P2002 female, who presents for the evaluation of the desire to lose weight. She has gained  10 pounds in last 4 months. The patient states the following symptoms sincestopping her weight loss therapy:increased appetite, decreased energy, and weight gain.  The patient has previously completed a phentermine course with good results and no ill effects. The patient specifically denies heart palpitations, anxiety, and insomnia.    Review of Systems: 10 point review of systems negative unless otherwise noted in HPI  Past Medical History:  Past Medical History:  Diagnosis Date  . Acute blood loss anemia 03/31/2013  . Hypertension in pregnancy 03/29/2013  . Postpartum care following cesarean delivery 03/31/2013  . S/P primary low transverse C-section 03/31/2013    Past Surgical History:  Past Surgical History:  Procedure Laterality Date  . CESAREAN SECTION N/A 03/30/2013   Procedure: Primary Cesarean Section Delivery Baby boy @ 2321, Apgars 9/9;  Surgeon: Robley Fries, MD;  Location: [facility];  Service: Obstetrics;  Laterality: N/A;  . CHOLECYSTECTOMY    . WISDOM TOOTH EXTRACTION      Gynecologic History: No LMP recorded. Patient is not currently having periods (Reason: IUD).  Obstetric History: [id number]  Family History:  No family history on file.  Social History:  Social History   Social History  . Marital status: Single    Spouse name: N/A  . Number of children: N/A  . Years of education: N/A   Occupational History  . Not on file.   Social History Main Topics  . Smoking status: Never Smoker  . Smokeless tobacco: Never Used  . Alcohol use No  . Drug use: No  . Sexual activity: Yes    Birth control/ protection: None, IUD   Other Topics Concern  . Not on file   Social History Narrative  . No narrative on  file    Allergies:  No Known Allergies  Medications: Prior to Admission medications   Medication Sig Start Date End Date Taking? Authorizing Provider  levonorgestrel (MIRENA) 20 MCG/24HR IUD 1 each by Intrauterine route once.   Yes [provider]    Physical Exam Blood pressure 130/80, pulse 88, height 5\' 4"  (1.626 m), weight 200 lb (90.7 kg), not currently breastfeeding. Body mass index is 34.33 kg/m.    General: NAD HEENT: normocephalic, anicteric Thyroid: no enlargement Pulmonary: no increased work of breathing Neurologic: Grossly intact Psychiatric: mood appropriate, affect full  Assessment: 26 y.o. [id number] No problem-specific Assessment & Plan notes found for this encounter.   Plan: Problem List Items Addressed This Visit    None    Visit Diagnoses    Class 1 obesity without serious comorbidity with body mass index (BMI) of 34.0 to 34.9 in adult, unspecified obesity type    -  Primary   Relevant Medications   phentermine (ADIPEX-P) 37.5 MG tablet      1) 1500 Calorie ADA Diet  2) Patient education given regarding appropriate lifestyle changes for weight loss including: regular physical activity, healthy coping strategies, caloric restriction and healthy eating patterns.  3) Patient will be started on weight loss medication. The risks and benefits and side effects of medication, such as Adipex (Phenteramine) ,  Tenuate (Diethylproprion), Belviq (lorcarsin), Contrave (buproprion/naltrexone), Qsymia (phentermine/topiramate), and Saxenda (liraglutide) is discussed. The pros and cons of suppressing appetite and boosting  metabolism is discussed. Risks of tolerence and addiction is discussed for selected agents discussed. Use of medicine will ne short term, such as 3-4 months at a time followed by a period of time off of the medicine to avoid these risks and side effects for Adipex, Qsymia, and Tenuate discussed. Pt to call with any negative side effects and agrees  to keep follow up appts.  4) Patient to take medication, with the benefits of appetite suppression and metabolism boost d/w pt, along with the side effects and risk factors of long term use that will be avoided with our use of short bursts of therapy. Rx provided.    5) 15 minutes face-to-face; with counseling/coordination of care > 50 percent of visit related to obesity and ongoing management/treatment   6) Follow up in 4 weeks to assess response

## 2017-02-11 ENCOUNTER — Ambulatory Visit (INDEPENDENT_AMBULATORY_CARE_PROVIDER_SITE_OTHER): Payer: BC Managed Care – PPO | Admitting: Obstetrics and Gynecology

## 2017-02-11 ENCOUNTER — Encounter: Payer: Self-pay | Admitting: Obstetrics and Gynecology

## 2017-02-11 VITALS — BP 112/68 | HR 118 | Ht 65.0 in | Wt 192.0 lb

## 2017-02-11 DIAGNOSIS — Z6831 Body mass index (BMI) 31.0-31.9, adult: Secondary | ICD-10-CM

## 2017-02-11 DIAGNOSIS — E669 Obesity, unspecified: Secondary | ICD-10-CM

## 2017-02-11 MED ORDER — PHENTERMINE HCL 37.5 MG PO TABS: 37.5000 mg | ORAL_TABLET | Freq: Every day | ORAL | 0 refills | Status: DC

## 2017-02-11 NOTE — Progress Notes (Signed)
Gynecology Office Visit  Chief Complaint:  Chief Complaint  Patient presents with  . Weight Check    History of Present Illness: Patientis a 26 y.o. G40P2002 female, who presents for the evaluation of the desire to lose weight. She has lost 8lbs pounds. The patient states the following symptoms since starting her weight loss therapy: appetite suppression, energy, and weight loss.  The patient also reports no other ill effects. The patient specifically denies heart palpitations, anxiety, and insomnia.    Review of Systems: 10 point review of systems negative unless otherwise noted in HPI  Past Medical History:  Past Medical History:  Diagnosis Date  . Acute blood loss anemia 03/31/2013  . Hypertension in pregnancy 03/29/2013  . Postpartum care following cesarean delivery 03/31/2013  . S/P primary low transverse C-section 03/31/2013    Past Surgical History:  Past Surgical History:  Procedure Laterality Date  . CESAREAN SECTION N/A 03/30/2013   Procedure: Primary Cesarean Section Delivery Baby boy @ 2321, Apgars 9/9;  Surgeon: Robley Fries, MD;  Location: [facility];  Service: Obstetrics;  Laterality: N/A;  . CHOLECYSTECTOMY    . WISDOM TOOTH EXTRACTION      Gynecologic History: No LMP recorded. Patient is not currently having periods (Reason: IUD).  Obstetric History: [id number]  Family History:  No family history on file.  Social History:  Social History   Social History  . Marital status: Single    Spouse name: N/A  . Number of children: N/A  . Years of education: N/A   Occupational History  . Not on file.   Social History Main Topics  . Smoking status: Never Smoker  . Smokeless tobacco: Never Used  . Alcohol use No  . Drug use: No  . Sexual activity: Yes    Birth control/ protection: None, IUD   Other Topics Concern  . Not on file   Social History Narrative  . No narrative on file    Allergies:  No Known Allergies  Medications: Prior to  Admission medications   Medication Sig Start Date End Date Taking? Authorizing Provider  levonorgestrel (MIRENA) 20 MCG/24HR IUD 1 each by Intrauterine route once.   Yes [provider]  phentermine (ADIPEX-P) 37.5 MG tablet Take 1 tablet (37.5 mg total) by mouth daily before breakfast. 01/10/17  Yes Vena Austria, MD    Physical Exam Blood pressure 112/68, pulse (!) 118, height  (1.651 m), weight 192 lb (87.1 kg), not currently breastfeeding. Body mass index is 31.95 kg/m.   General: NAD HEENT: normocephalic, anicteric Thyroid: no enlargement Pulmonary: no increased work of breathing Neurologic: Grossly intact Psychiatric: mood appropriate, affect full  Assessment: 26 y.o. [id number] No problem-specific Assessment & Plan notes found for this encounter.   Plan: Problem List Items Addressed This Visit    None      1) 1500 Calorie ADA Diet  2) Patient education given regarding appropriate lifestyle changes for weight loss including: regular physical activity, healthy coping strategies, caloric restriction and healthy eating patterns.  3) Patient will be started on weight loss medication. The risks and benefits and side effects of medication, such as Adipex (Phenteramine) ,  Tenuate (Diethylproprion), Belviq (lorcarsin), Contrave (buproprion/naltrexone), Qsymia (phentermine/topiramate), and Saxenda (liraglutide) is discussed. The pros and cons of suppressing appetite and boosting metabolism is discussed. Risks of tolerence and addiction is discussed for selected agents discussed. Use of medicine will ne short term, such as 3-4 months at a time followed by a period of  time off of the medicine to avoid these risks and side effects for Adipex, Qsymia, and Tenuate discussed. Pt to call with any negative side effects and agrees to keep follow up appts.  4) Patient to take medication, with the benefits of appetite suppression and metabolism boost d/w pt, along with the side  effects and risk factors of long term use that will be avoided with our use of short bursts of therapy. Rx provided.    5) 15 minutes face-to-face; with counseling/coordination of care > 50 percent of visit related to obesity and ongoing management/treatment   6) Follow up in 4 weeks to assess response

## 2017-03-10 ENCOUNTER — Ambulatory Visit: Payer: BC Managed Care – PPO | Admitting: Obstetrics and Gynecology

## 2017-03-24 ENCOUNTER — Encounter: Payer: Self-pay | Admitting: Obstetrics and Gynecology

## 2017-03-24 ENCOUNTER — Ambulatory Visit (INDEPENDENT_AMBULATORY_CARE_PROVIDER_SITE_OTHER): Payer: BC Managed Care – PPO | Admitting: Obstetrics and Gynecology

## 2017-03-24 VITALS — BP 126/68 | HR 110 | Wt 191.0 lb

## 2017-03-24 DIAGNOSIS — E669 Obesity, unspecified: Secondary | ICD-10-CM

## 2017-03-24 DIAGNOSIS — Z6831 Body mass index (BMI) 31.0-31.9, adult: Secondary | ICD-10-CM | POA: Diagnosis not present

## 2017-03-24 MED ORDER — DIETHYLPROPION HCL ER 75 MG PO TB24: 1.0000 | ORAL_TABLET | Freq: Every day | ORAL | 0 refills | Status: DC

## 2017-03-24 NOTE — Progress Notes (Signed)
Gynecology Office Visit  Chief Complaint:  Chief Complaint  Patient presents with  . Weight Check    History of Present Illness: Patientis a 26 y.o. 702P2002 female, who presents for the evaluation of the desire to lose weight. She has lost 1 pounds for 2 month total weight loss of 9lbs. The patient states the following symptoms since starting her weight loss therapy: appetite suppression, energy, and weight loss.  The patient also reports no other ill effects. The patient specifically denies heart palpitations, anxiety, and insomnia.    Review of Systems: 10 point review of systems negative unless otherwise noted in HPI  Past Medical History:  Past Medical History:  Diagnosis Date  . Acute blood loss anemia 03/31/2013  . Hypertension in pregnancy 03/29/2013  . Postpartum care following cesarean delivery 03/31/2013  . S/P primary low transverse C-section 03/31/2013    Past Surgical History:  Past Surgical History:  Procedure Laterality Date  . CHOLECYSTECTOMY    . WISDOM TOOTH EXTRACTION      Gynecologic History: No LMP recorded. Patient is not currently having periods (Reason: IUD).  Obstetric History: [id number]  Family History:  No family history on file.  Social History:  Social History   Socioeconomic History  . Marital status: Single    Spouse name: Not on file  . Number of children: Not on file  . Years of education: Not on file  . Highest education level: Not on file  Social Needs  . Financial resource strain: Not on file  . Food insecurity - worry: Not on file  . Food insecurity - inability: Not on file  . Transportation needs - medical: Not on file  . Transportation needs - non-medical: Not on file  Occupational History  . Not on file  Tobacco Use  . Smoking status: Never Smoker  . Smokeless tobacco: Never Used  Substance and Sexual Activity  . Alcohol use: No  . Drug use: No  . Sexual activity: Yes    Birth control/protection: None, IUD    Other Topics Concern  . Not on file  Social History Narrative  . Not on file    Allergies:  No Known Allergies  Medications: Prior to Admission medications   Medication Sig Start Date End Date Taking? Authorizing Provider  levonorgestrel (MIRENA) 20 MCG/24HR IUD 1 each by Intrauterine route once.    [provider]  phentermine (ADIPEX-P) 37.5 MG tablet Take 1 tablet (37.5 mg total) by mouth daily before breakfast. 02/11/17   Vena AustriaStaebler, Kiing Deakin, MD    Physical Exam Blood pressure 126/68, pulse (!) 110, weight 191 lb (86.6 kg), not currently breastfeeding. Body mass index is 31.78 kg/m.   General: NAD HEENT: normocephalic, anicteric Thyroid: no enlargement Pulmonary: no increased work of breathing Neurologic: Grossly intact Psychiatric: mood appropriate, affect full  Assessment: 26 y.o. [id number] medical weight loss visit  Plan: Problem List Items Addressed This Visit    None    Visit Diagnoses    Class 1 obesity without serious comorbidity with body mass index (BMI) of 31.0 to 31.9 in adult, unspecified obesity type    -  Primary   Relevant Medications   Diethylpropion HCl CR 75 MG TB24      1) 1500 Calorie ADA Diet  2) Patient education given regarding appropriate lifestyle changes for weight loss including: regular physical activity, healthy coping strategies, caloric restriction and healthy eating patterns.  3) Patient will be started on weight loss medication. The risks and benefits  and side effects of medication, such as Adipex (Phenteramine) ,  Tenuate (Diethylproprion), Belviq (lorcarsin), Contrave (buproprion/naltrexone), Qsymia (phentermine/topiramate), and Saxenda (liraglutide) is discussed. The pros and cons of suppressing appetite and boosting metabolism is discussed. Risks of tolerence and addiction is discussed for selected agents discussed. Use of medicine will ne short term, such as 3-4 months at a time followed by a period of time off of the  medicine to avoid these risks and side effects for Adipex, Qsymia, and Tenuate discussed. Pt to call with any negative side effects and agrees to keep follow up appts. - D/C phentermine trial of tenuate  4) Patient to take medication, with the benefits of appetite suppression and metabolism boost d/w pt, along with the side effects and risk factors of long term use that will be avoided with our use of short bursts of therapy. Rx provided.    5) 15 minutes face-to-face; with counseling/coordination of care > 50 percent of visit related to obesity and ongoing management/treatment   6) Follow up in 4 weeks to assess response

## 2017-04-22 ENCOUNTER — Other Ambulatory Visit: Payer: Self-pay

## 2017-04-22 ENCOUNTER — Ambulatory Visit (INDEPENDENT_AMBULATORY_CARE_PROVIDER_SITE_OTHER): Payer: BC Managed Care – PPO | Admitting: Obstetrics and Gynecology

## 2017-04-22 ENCOUNTER — Encounter: Payer: Self-pay | Admitting: Obstetrics and Gynecology

## 2017-04-22 VITALS — BP 112/78 | HR 84 | Ht 64.0 in | Wt 192.0 lb

## 2017-04-22 DIAGNOSIS — E669 Obesity, unspecified: Secondary | ICD-10-CM

## 2017-04-22 DIAGNOSIS — Z6832 Body mass index (BMI) 32.0-32.9, adult: Secondary | ICD-10-CM

## 2017-04-22 MED ORDER — NALTREXONE-BUPROPION HCL ER 8-90 MG PO TB12: 2.0000 | ORAL_TABLET | Freq: Two times a day (BID) | ORAL | 1 refills | Status: DC

## 2017-04-22 MED ORDER — NALTREXONE-BUPROPION HCL ER 8-90 MG PO TB12: ORAL_TABLET | ORAL | 0 refills | Status: DC

## 2017-04-22 NOTE — Progress Notes (Signed)
Gynecology Office Visit  Chief Complaint:  Chief Complaint  Patient presents with  . Weight Check    Doing well    History of Present Illness: Patientis a 26 y.o. 542P2002 female, who presents for the evaluation of the desire to lose weight. She has gained 1 pounds. The patient states the following symptoms since starting her weight loss therapy: appetite suppression, energy, and weight loss.  The patient also reports no other ill effects. The patient specifically denies heart palpitations, anxiety, and insomnia.    Review of Systems: 10 point review of systems negative unless otherwise noted in HPI  Past Medical History:  Past Medical History:  Diagnosis Date  . Acute blood loss anemia 03/31/2013  . Hypertension in pregnancy 03/29/2013  . Postpartum care following cesarean delivery 03/31/2013  . S/P primary low transverse C-section 03/31/2013    Past Surgical History:  Past Surgical History:  Procedure Laterality Date  . CESAREAN SECTION N/A 03/30/2013   Procedure: Primary Cesarean Section Delivery Baby boy @ 2321, Apgars 9/9;  Surgeon: Robley FriesVaishali R Mody, MD;  Location: [facility];  Service: Obstetrics;  Laterality: N/A;  . CHOLECYSTECTOMY    . WISDOM TOOTH EXTRACTION      Gynecologic History: No LMP recorded. Patient is not currently having periods (Reason: IUD).  Obstetric History: [id number]  Family History:  History reviewed. No pertinent family history.  Social History:  Social History   Socioeconomic History  . Marital status: Single    Spouse name: Not on file  . Number of children: Not on file  . Years of education: Not on file  . Highest education level: Not on file  Social Needs  . Financial resource strain: Not on file  . Food insecurity - worry: Not on file  . Food insecurity - inability: Not on file  . Transportation needs - medical: Not on file  . Transportation needs - non-medical: Not on file  Occupational History  . Not on file  Tobacco Use  .  Smoking status: Never Smoker  . Smokeless tobacco: Never Used  Substance and Sexual Activity  . Alcohol use: No  . Drug use: No  . Sexual activity: Yes    Birth control/protection: None, IUD  Other Topics Concern  . Not on file  Social History Narrative  . Not on file    Allergies:  No Known Allergies  Medications: Prior to Admission medications   Medication Sig Start Date End Date Taking? Authorizing Provider  Diethylpropion HCl CR 75 MG TB24 Take 1 tablet (75 mg total) daily before breakfast by mouth. 03/24/17  Yes Vena AustriaStaebler, Kaemon Barnett, MD  levonorgestrel (MIRENA) 20 MCG/24HR IUD 1 each by Intrauterine route once.   Yes [provider]  phentermine (ADIPEX-P) 37.5 MG tablet Take 1 tablet (37.5 mg total) by mouth daily before breakfast. Patient not taking: Reported on 04/22/2017 02/11/17   Vena AustriaStaebler, Yida Hyams, MD    Physical Exam Blood pressure 112/78, pulse 84, height 5\' 4"  (1.626 m), weight 192 lb (87.1 kg), not currently breastfeeding. Body mass index is 32.96 kg/m.  General: NAD HEENT: normocephalic, anicteric Thyroid: no enlargement Pulmonary: no increased work of breathing Neurologic: Grossly intact Psychiatric: mood appropriate, affect full  Assessment: 26 y.o. [id number] medical weight loss follow up Plan: Problem List Items Addressed This Visit    None    Visit Diagnoses    Class 1 obesity with body mass index (BMI) of 32.0 to 32.9 in adult, unspecified obesity type, unspecified whether serious comorbidity present    -  Primary   Relevant Medications   Naltrexone-Bupropion HCl ER (CONTRAVE) 8-90 MG TB12   Naltrexone-Bupropion HCl ER (CONTRAVE) 8-90 MG TB12      1) 1500 Calorie ADA Diet  2) Patient education given regarding appropriate lifestyle changes for weight loss including: regular physical activity, healthy coping strategies, caloric restriction and healthy eating patterns.  3) Patient will be started on weight loss medication. The risks and  benefits and side effects of medication, such as Adipex (Phenteramine) ,  Tenuate (Diethylproprion), Belviq (lorcarsin), Contrave (buproprion/naltrexone), Qsymia (phentermine/topiramate), and Saxenda (liraglutide) is discussed. The pros and cons of suppressing appetite and boosting metabolism is discussed. Risks of tolerence and addiction is discussed for selected agents discussed. Use of medicine will ne short term, such as 3-4 months at a time followed by a period of time off of the medicine to avoid these risks and side effects for Adipex, Qsymia, and Tenuate discussed. Pt to call with any negative side effects and agrees to keep follow up appts. -switch to contrave from phentermine  4) Patient to take medication, with the benefits of appetite suppression and metabolism boost d/w pt, along with the side effects and risk factors of long term use that will be avoided with our use of short bursts of therapy. Rx provided.    5) 15 minutes face-to-face; with counseling/coordination of care > 50 percent of visit related to obesity and ongoing management/treatment   6) Follow up in 12 weeks to assess response

## 2017-05-05 ENCOUNTER — Encounter: Payer: Self-pay | Admitting: Obstetrics and Gynecology

## 2017-07-22 ENCOUNTER — Ambulatory Visit (INDEPENDENT_AMBULATORY_CARE_PROVIDER_SITE_OTHER): Payer: BC Managed Care – PPO | Admitting: Obstetrics and Gynecology

## 2017-07-22 ENCOUNTER — Encounter: Payer: Self-pay | Admitting: Obstetrics and Gynecology

## 2017-07-22 VITALS — BP 114/64 | HR 75 | Ht 65.0 in | Wt 195.0 lb

## 2017-07-22 DIAGNOSIS — Z6832 Body mass index (BMI) 32.0-32.9, adult: Secondary | ICD-10-CM | POA: Diagnosis not present

## 2017-07-22 DIAGNOSIS — E669 Obesity, unspecified: Secondary | ICD-10-CM | POA: Diagnosis not present

## 2017-07-22 MED ORDER — LIRAGLUTIDE -WEIGHT MANAGEMENT 18 MG/3ML ~~LOC~~ SOPN: 3.0000 mg | PEN_INJECTOR | Freq: Every day | SUBCUTANEOUS | 2 refills | Status: DC

## 2017-07-22 MED ORDER — INSULIN PEN NEEDLE 32G X 6 MM MISC: 1.0000 [IU] | Freq: Every day | 3 refills | Status: DC

## 2017-07-22 NOTE — Progress Notes (Signed)
Gynecology Office Visit  Chief Complaint:  Chief Complaint  Patient presents with  . Weight Check    History of Present Illness: Patientis a 27 y.o. 702P2002 female, who presents for the evaluation of the desire to lose weight. She has gained 3 pounds. The patient states she had failed to note significant appetite suppression, energy, and weight loss since starting the contrave.  The patient also reports no other ill effects. The patient specifically denies heart palpitations, anxiety, and insomnia.    Review of Systems: 10 point review of systems negative unless otherwise noted in HPI  Past Medical History:  Past Medical History:  Diagnosis Date  . Acute blood loss anemia 03/31/2013  . Hypertension in pregnancy 03/29/2013  . Postpartum care following cesarean delivery 03/31/2013  . S/P primary low transverse C-section 03/31/2013    Past Surgical History:  Past Surgical History:  Procedure Laterality Date  . CESAREAN SECTION N/A 03/30/2013   Procedure: Primary Cesarean Section Delivery Baby boy @ 2321, Apgars 9/9;  Surgeon: Robley FriesVaishali R Mody, MD;  Location: [facility];  Service: Obstetrics;  Laterality: N/A;  . CHOLECYSTECTOMY    . WISDOM TOOTH EXTRACTION      Gynecologic History: No LMP recorded. Patient is not currently having periods (Reason: IUD).  Obstetric History: [id number]  Family History:  No family history on file.  Social History:  Social History   Socioeconomic History  . Marital status: Single    Spouse name: Not on file  . Number of children: Not on file  . Years of education: Not on file  . Highest education level: Not on file  Social Needs  . Financial resource strain: Not on file  . Food insecurity - worry: Not on file  . Food insecurity - inability: Not on file  . Transportation needs - medical: Not on file  . Transportation needs - non-medical: Not on file  Occupational History  . Not on file  Tobacco Use  . Smoking status: Never Smoker  .  Smokeless tobacco: Never Used  Substance and Sexual Activity  . Alcohol use: No  . Drug use: No  . Sexual activity: Yes    Birth control/protection: None, IUD  Other Topics Concern  . Not on file  Social History Narrative  . Not on file    Allergies:  No Known Allergies  Medications: Prior to Admission medications   Medication Sig Start Date End Date Taking? Authorizing Provider  Naltrexone-Bupropion HCl ER (CONTRAVE) 8-90 MG TB12 1 tablet po once daily for 7 days, 1 tablet po bid for 7 days,  2 tablets po every morning and 1 tablet po every evening for 7 days, then 2 tablets po bid maintenance 04/22/17   Vena AustriaStaebler, Christmas Faraci, MD  Naltrexone-Bupropion HCl ER (CONTRAVE) 8-90 MG TB12 Take 2 tablets by mouth 2 (two) times daily. (maintenance dose) 04/22/17   Vena AustriaStaebler, Hurbert Duran, MD  phentermine (ADIPEX-P) 37.5 MG tablet Take 1 tablet (37.5 mg total) by mouth daily before breakfast. Patient not taking: Reported on 04/22/2017 02/11/17   Vena AustriaStaebler, Breyson Kelm, MD    Physical Exam Blood pressure 114/64, pulse 75, height 5\' 5"  (1.651 m), weight 195 lb (88.5 kg), not currently breastfeeding. Body mass index is 32.45 kg/m.  Wt Readings from Last 3 Encounters:  07/22/17 195 lb (88.5 kg)  04/22/17 192 lb (87.1 kg)  03/24/17 191 lb (86.6 kg)    General: NAD HEENT: normocephalic, anicteric Thyroid: no enlargement Pulmonary: no increased work of breathing Neurologic: Grossly intact Psychiatric:  mood appropriate, affect full  Assessment: 27 y.o. [id number] No problem-specific Assessment & Plan notes found for this encounter.   Plan: Problem List Items Addressed This Visit    None    Visit Diagnoses    Class 1 obesity with serious comorbidity and body mass index (BMI) of 32.0 to 32.9 in adult, unspecified obesity type    -  Primary   Relevant Medications   Liraglutide -Weight Management (SAXENDA) 18 MG/3ML SOPN      1) 1500 Calorie ADA Diet  2) Patient education given regarding appropriate  lifestyle changes for weight loss including: regular physical activity, healthy coping strategies, caloric restriction and healthy eating patterns.  3) Patient will be started on weight loss medication. The risks and benefits and side effects of medication, such as Adipex (Phenteramine) ,  Tenuate (Diethylproprion), Belviq (lorcarsin), Contrave (buproprion/naltrexone), Qsymia (phentermine/topiramate), and Saxenda (liraglutide) is discussed. The pros and cons of suppressing appetite and boosting metabolism is discussed. Risks of tolerence and addiction is discussed for selected agents discussed. Use of medicine will ne short term, such as 3-4 months at a time followed by a period of time off of the medicine to avoid these risks and side effects for Adipex, Qsymia, and Tenuate discussed. Pt to call with any negative side effects and agrees to keep follow up appts. - patient did not achieve the desired 4% reduction in body weight after 3 months of contrave - had failed to achieve meaningful weight loss or maintain weight loss on phentermine - DISCONTINUE CONTRAVE SWITCH TO Saxenda  4) Patient to take medication, with the benefits of appetite suppression and metabolism boost d/w pt, along with the side effects and risk factors of long term use that will be avoided with our use of short bursts of therapy. Rx provided.    5) 15 minutes face-to-face; with counseling/coordination of care > 50 percent of visit related to obesity and ongoing management/treatment   6)    Vena Austria, MD, [facility], [facility] 07/23/2017, 9:37 PM

## 2017-08-12 ENCOUNTER — Encounter: Payer: Self-pay | Admitting: Obstetrics and Gynecology

## 2017-10-23 ENCOUNTER — Ambulatory Visit (INDEPENDENT_AMBULATORY_CARE_PROVIDER_SITE_OTHER): Payer: BC Managed Care – PPO | Admitting: Obstetrics and Gynecology

## 2017-10-23 ENCOUNTER — Encounter: Payer: Self-pay | Admitting: Obstetrics and Gynecology

## 2017-10-23 VITALS — BP 120/80 | Ht 65.0 in | Wt 196.0 lb

## 2017-10-23 DIAGNOSIS — E669 Obesity, unspecified: Secondary | ICD-10-CM | POA: Diagnosis not present

## 2017-10-23 DIAGNOSIS — Z6832 Body mass index (BMI) 32.0-32.9, adult: Secondary | ICD-10-CM

## 2017-10-23 MED ORDER — PHENTERMINE HCL 37.5 MG PO TABS: 37.5000 mg | ORAL_TABLET | Freq: Every day | ORAL | 0 refills | Status: DC

## 2017-10-23 NOTE — Progress Notes (Signed)
Gynecology Office Visit  Chief Complaint:  Chief Complaint  Patient presents with  . Weight Check    History of Present Illness: Patientis a 27 y.o. G69P2002 female, who presents for the evaluation of the desire to lose weight. She has gained 1 pounds 3 months. The patient states the following symptoms since starting her weight loss therapy: appetite suppression, energy, and weight loss.  The patient also reports no other ill effects. The patient specifically denies heart palpitations, anxiety, and insomnia.    Review of Systems: 10 point review of systems negative unless otherwise noted in HPI  Past Medical History:  Past Medical History:  Diagnosis Date  . Acute blood loss anemia 03/31/2013  . Hypertension in pregnancy 03/29/2013  . Postpartum care following cesarean delivery 03/31/2013  . S/P primary low transverse C-section 03/31/2013    Past Surgical History:  Past Surgical History:  Procedure Laterality Date  . CESAREAN SECTION N/A 03/30/2013   Procedure: Primary Cesarean Section Delivery Baby boy @ 2321, Apgars 9/9;  Surgeon: Robley Fries, MD;  Location: [facility];  Service: Obstetrics;  Laterality: N/A;  . CHOLECYSTECTOMY    . WISDOM TOOTH EXTRACTION      Gynecologic History: No LMP recorded. (Menstrual status: IUD).  Obstetric History: [id number]  Family History:  No family history on file.  Social History:  Social History   Socioeconomic History  . Marital status: Single    Spouse name: Not on file  . Number of children: Not on file  . Years of education: Not on file  . Highest education level: Not on file  Occupational History  . Not on file  Social Needs  . Financial resource strain: Not on file  . Food insecurity:    Worry: Not on file    Inability: Not on file  . Transportation needs:    Medical: Not on file    Non-medical: Not on file  Tobacco Use  . Smoking status: Never Smoker  . Smokeless tobacco: Never Used  Substance and Sexual  Activity  . Alcohol use: No  . Drug use: No  . Sexual activity: Yes    Birth control/protection: None, IUD  Lifestyle  . Physical activity:    Days per week: Not on file    Minutes per session: Not on file  . Stress: Not on file  Relationships  . Social connections:    Talks on phone: Not on file    Gets together: Not on file    Attends religious service: Not on file    Active member of club or organization: Not on file    Attends meetings of clubs or organizations: Not on file    Relationship status: Not on file  . Intimate partner violence:    Fear of current or ex partner: Not on file    Emotionally abused: Not on file    Physically abused: Not on file    Forced sexual activity: Not on file  Other Topics Concern  . Not on file  Social History Narrative  . Not on file    Allergies:  No Known Allergies  Medications: Prior to Admission medications   Medication Sig Start Date End Date Taking? Authorizing Provider  Insulin Pen Needle (NOVOFINE) 32G X 6 MM MISC 1 Units by Does not apply route daily. Patient not taking: Reported on 10/23/2017 07/22/17   Vena Austria, MD  Liraglutide -Weight Management (SAXENDA) 18 MG/3ML SOPN Inject 3 mg into the skin daily. Patient not  taking: Reported on 10/23/2017 07/22/17   Vena AustriaStaebler, Ronell Duffus, MD  Naltrexone-Bupropion HCl ER (CONTRAVE) 8-90 MG TB12 1 tablet po once daily for 7 days, 1 tablet po bid for 7 days,  2 tablets po every morning and 1 tablet po every evening for 7 days, then 2 tablets po bid maintenance Patient not taking: Reported on 10/23/2017 04/22/17   Vena AustriaStaebler, Maylynn Orzechowski, MD    Physical Exam Blood pressure 120/80, height 5\' 5"  (1.651 m), weight 196 lb (88.9 kg), not currently breastfeeding. Wt Readings from Last 3 Encounters:  10/23/17 196 lb (88.9 kg)  07/22/17 195 lb (88.5 kg)  04/22/17 192 lb (87.1 kg)  Body mass index is 32.62 kg/m.   General: NAD HEENT: normocephalic, anicteric Thyroid: no enlargement Pulmonary: no  increased work of breathing Neurologic: Grossly intact Psychiatric: mood appropriate, affect full  Assessment: 27 y.o. [id number] No problem-specific Assessment & Plan notes found for this encounter.   Plan: Problem List Items Addressed This Visit    None      1) 1500 Calorie ADA Diet  2) Patient education given regarding appropriate lifestyle changes for weight loss including: regular physical activity, healthy coping strategies, caloric restriction and healthy eating patterns.  3) Patient will be started on weight loss medication. The risks and benefits and side effects of medication, such as Adipex (Phenteramine) ,  Tenuate (Diethylproprion), Belviq (lorcarsin), Contrave (buproprion/naltrexone), Qsymia (phentermine/topiramate), and Saxenda (liraglutide) is discussed. The pros and cons of suppressing appetite and boosting metabolism is discussed. Risks of tolerence and addiction is discussed for selected agents discussed. Use of medicine will ne short term, such as 3-4 months at a time followed by a period of time off of the medicine to avoid these risks and side effects for Adipex, Qsymia, and Tenuate discussed. Pt to call with any negative side effects and agrees to keep follow up appts.  4) Patient to take medication, with the benefits of appetite suppression and metabolism boost d/w pt, along with the side effects and risk factors of long term use that will be avoided with our use of short bursts of therapy. Rx provided.  - retry phentermine has failed contrave and saxenda  5) 15 minutes face-to-face; with counseling/coordination of care > 50 percent of visit related to obesity and ongoing management/treatment   6)  Return in about 1 month (around 11/20/2017) for wt check.    Vena AustriaAndreas Sharay Bellissimo, MD, Evern CoreFACOG [facility], [facility] 10/23/2017, 4:34 PM

## 2017-11-24 ENCOUNTER — Encounter: Payer: Self-pay | Admitting: Obstetrics and Gynecology

## 2017-11-24 ENCOUNTER — Ambulatory Visit (INDEPENDENT_AMBULATORY_CARE_PROVIDER_SITE_OTHER): Payer: BC Managed Care – PPO | Admitting: Obstetrics and Gynecology

## 2017-11-24 VITALS — BP 110/68 | HR 93 | Ht 65.0 in | Wt 187.0 lb

## 2017-11-24 DIAGNOSIS — Z6831 Body mass index (BMI) 31.0-31.9, adult: Secondary | ICD-10-CM | POA: Diagnosis not present

## 2017-11-24 DIAGNOSIS — E669 Obesity, unspecified: Secondary | ICD-10-CM

## 2017-11-24 MED ORDER — PHENTERMINE HCL 37.5 MG PO TABS: 37.5000 mg | ORAL_TABLET | Freq: Every day | ORAL | 0 refills | Status: DC

## 2017-11-24 NOTE — Progress Notes (Signed)
Gynecology Office Visit  Chief Complaint:  Chief Complaint  Patient presents with  . Weight Check    History of Present Illness: Patientis a 27 y.o. 142P2002 female, who presents for the evaluation of the desire to lose weight. She has lost 9 pounds 1 months. The patient states the following symptoms since starting her weight loss therapy: appetite suppression, energy, and weight loss.  The patient also reports no other ill effects. The patient specifically denies heart palpitations, anxiety, and insomnia.    Review of Systems: 10 point review of systems negative unless otherwise noted in HPI  Past Medical History:  Past Medical History:  Diagnosis Date  . Acute blood loss anemia 03/31/2013  . Hypertension in pregnancy 03/29/2013  . Postpartum care following cesarean delivery 03/31/2013  . S/P primary low transverse C-section 03/31/2013    Past Surgical History:  Past Surgical History:  Procedure Laterality Date  . CESAREAN SECTION N/A 03/30/2013   Procedure: Primary Cesarean Section Delivery Baby boy @ 2321, Apgars 9/9;  Surgeon: Robley FriesVaishali R Mody, MD;  Location: [facility];  Service: Obstetrics;  Laterality: N/A;  . CHOLECYSTECTOMY    . WISDOM TOOTH EXTRACTION      Gynecologic History: No LMP recorded. (Menstrual status: IUD).  Obstetric History: [id number]  Family History:  No family history on file.  Social History:  Social History   Socioeconomic History  . Marital status: Single    Spouse name: Not on file  . Number of children: Not on file  . Years of education: Not on file  . Highest education level: Not on file  Occupational History  . Not on file  Social Needs  . Financial resource strain: Not on file  . Food insecurity:    Worry: Not on file    Inability: Not on file  . Transportation needs:    Medical: Not on file    Non-medical: Not on file  Tobacco Use  . Smoking status: Never Smoker  . Smokeless tobacco: Never Used  Substance and Sexual  Activity  . Alcohol use: No  . Drug use: No  . Sexual activity: Yes    Birth control/protection: None, IUD  Lifestyle  . Physical activity:    Days per week: Not on file    Minutes per session: Not on file  . Stress: Not on file  Relationships  . Social connections:    Talks on phone: Not on file    Gets together: Not on file    Attends religious service: Not on file    Active member of club or organization: Not on file    Attends meetings of clubs or organizations: Not on file    Relationship status: Not on file  . Intimate partner violence:    Fear of current or ex partner: Not on file    Emotionally abused: Not on file    Physically abused: Not on file    Forced sexual activity: Not on file  Other Topics Concern  . Not on file  Social History Narrative  . Not on file    Allergies:  No Known Allergies  Medications: Prior to Admission medications   Medication Sig Start Date End Date Taking? Authorizing Provider  levonorgestrel (MIRENA) 20 MCG/24HR IUD 1 each by Intrauterine route once.   Yes [provider]  phentermine (ADIPEX-P) 37.5 MG tablet Take 1 tablet (37.5 mg total) by mouth daily before breakfast. 10/23/17  Yes Vena AustriaStaebler, Macari Zalesky, MD    Physical Exam Blood  pressure 110/68, pulse 93, height 5\' 5"  (1.651 m), weight 187 lb (84.8 kg), not currently breastfeeding. Wt Readings from Last 3 Encounters:  11/24/17 187 lb (84.8 kg)  10/23/17 196 lb (88.9 kg)  07/22/17 195 lb (88.5 kg)  Body mass index is 31.12 kg/m.  General: NAD HEENT: normocephalic, anicteric Thyroid: no enlargement Pulmonary: no increased work of breathing Neurologic: Grossly intact Psychiatric: mood appropriate, affect full  Assessment: 27 y.o. [id number] medical weight loss follow up  Plan: Problem List Items Addressed This Visit    None    Visit Diagnoses    Class 1 obesity without serious comorbidity with body mass index (BMI) of 31.0 to 31.9 in adult, unspecified obesity type     -  Primary   Relevant Medications   phentermine (ADIPEX-P) 37.5 MG tablet      1) 1500 Calorie ADA Diet  2) Patient education given regarding appropriate lifestyle changes for weight loss including: regular physical activity, healthy coping strategies, caloric restriction and healthy eating patterns.  3) Patient to take medication, with the benefits of appetite suppression and metabolism boost d/w pt, along with the side effects and risk factors of long term use that will be avoided with our use of short bursts of therapy. Rx provided, good weight loss this month.   4) 15 minutes face-to-face; with counseling/coordination of care > 50 percent of visit related to obesity and ongoing management/treatment   5)  Return in about 1 month (around 12/22/2017) for wt check.    Vena Austria, MD, [facility], [facility] 11/24/2017, 10:34 AM

## 2017-12-23 ENCOUNTER — Ambulatory Visit (INDEPENDENT_AMBULATORY_CARE_PROVIDER_SITE_OTHER): Payer: BC Managed Care – PPO | Admitting: Obstetrics and Gynecology

## 2017-12-23 ENCOUNTER — Encounter: Payer: Self-pay | Admitting: Obstetrics and Gynecology

## 2017-12-23 VITALS — BP 110/62 | HR 81 | Ht 65.0 in | Wt 182.0 lb

## 2017-12-23 DIAGNOSIS — Z6830 Body mass index (BMI) 30.0-30.9, adult: Secondary | ICD-10-CM | POA: Diagnosis not present

## 2017-12-23 DIAGNOSIS — E669 Obesity, unspecified: Secondary | ICD-10-CM | POA: Diagnosis not present

## 2017-12-23 DIAGNOSIS — Z713 Dietary counseling and surveillance: Secondary | ICD-10-CM

## 2017-12-23 MED ORDER — PHENTERMINE HCL 37.5 MG PO TABS: 37.5000 mg | ORAL_TABLET | Freq: Every day | ORAL | 0 refills | Status: DC

## 2017-12-23 NOTE — Progress Notes (Signed)
Gynecology Office Visit  Chief Complaint:  Chief Complaint  Patient presents with  . Follow-up    Weight check    History of Present Illness: Patientis a 27 y.o. 502P2002 female, who presents for the evaluation of the desire to lose weight. She has lost 5 pounds 1 months, for 2 month weight loss of 14lbs. The patient states the following symptoms since starting her weight loss therapy: appetite suppression, energy, and weight loss.  The patient also reports no other ill effects. The patient specifically denies heart palpitations, anxiety, and insomnia.    Review of Systems: 10 point review of systems negative unless otherwise noted in HPI  Past Medical History:  Past Medical History:  Diagnosis Date  . Acute blood loss anemia 03/31/2013  . Hypertension in pregnancy 03/29/2013  . Postpartum care following cesarean delivery 03/31/2013  . S/P primary low transverse C-section 03/31/2013    Past Surgical History:  Past Surgical History:  Procedure Laterality Date  . CESAREAN SECTION N/A 03/30/2013   Procedure: Primary Cesarean Section Delivery Baby boy @ 2321, Apgars 9/9;  Surgeon: Robley FriesVaishali R Mody, MD;  Location: [facility];  Service: Obstetrics;  Laterality: N/A;  . CHOLECYSTECTOMY    . WISDOM TOOTH EXTRACTION      Gynecologic History: No LMP recorded. (Menstrual status: IUD).  Obstetric History: [id number]  Family History:  History reviewed. No pertinent family history.  Social History:  Social History   Socioeconomic History  . Marital status: Single    Spouse name: Not on file  . Number of children: Not on file  . Years of education: Not on file  . Highest education level: Not on file  Occupational History  . Not on file  Social Needs  . Financial resource strain: Not on file  . Food insecurity:    Worry: Not on file    Inability: Not on file  . Transportation needs:    Medical: Not on file    Non-medical: Not on file  Tobacco Use  . Smoking status: Never  Smoker  . Smokeless tobacco: Never Used  Substance and Sexual Activity  . Alcohol use: No  . Drug use: No  . Sexual activity: Yes    Birth control/protection: None, IUD  Lifestyle  . Physical activity:    Days per week: Not on file    Minutes per session: Not on file  . Stress: Not on file  Relationships  . Social connections:    Talks on phone: Not on file    Gets together: Not on file    Attends religious service: Not on file    Active member of club or organization: Not on file    Attends meetings of clubs or organizations: Not on file    Relationship status: Not on file  . Intimate partner violence:    Fear of current or ex partner: Not on file    Emotionally abused: Not on file    Physically abused: Not on file    Forced sexual activity: Not on file  Other Topics Concern  . Not on file  Social History Narrative  . Not on file    Allergies:  No Known Allergies  Medications: Prior to Admission medications   Medication Sig Start Date End Date Taking? Authorizing Provider  levonorgestrel (MIRENA) 20 MCG/24HR IUD 1 each by Intrauterine route once.   Yes [provider]  phentermine (ADIPEX-P) 37.5 MG tablet Take 1 tablet (37.5 mg total) by mouth daily before breakfast.  11/24/17  Yes Vena Austria, MD    Physical Exam Blood pressure 110/62, pulse 81, height 5\' 5"  (1.651 m), weight 182 lb (82.6 kg), not currently breastfeeding. Body mass index is 30.29 kg/m.  Wt Readings from Last 3 Encounters:  12/23/17 182 lb (82.6 kg)  11/24/17 187 lb (84.8 kg)  10/23/17 196 lb (88.9 kg)    General: NAD HEENT: normocephalic, anicteric Thyroid: no enlargement Pulmonary: no increased work of breathing Neurologic: Grossly intact Psychiatric: mood appropriate, affect full  Assessment: 27 y.o. [id number] medical weight loss follow up  Plan: Problem List Items Addressed This Visit    None    Visit Diagnoses    Class 1 obesity without serious comorbidity with body  mass index (BMI) of 30.0 to 30.9 in adult, unspecified obesity type    -  Primary   Relevant Medications   phentermine (ADIPEX-P) 37.5 MG tablet   Encounter for weight loss counseling          1) 1500 Calorie ADA Diet  2) Patient education given regarding appropriate lifestyle changes for weight loss including: regular physical activity, healthy coping strategies, caloric restriction and healthy eating patterns.  3) Patient to take medication, with the benefits of appetite suppression and metabolism boost d/w pt, along with the side effects and risk factors of long term use that will be avoided with our use of short bursts of therapy. Rx provided.    4) 15 minutes face-to-face; with counseling/coordination of care > 50 percent of visit related to obesity and ongoing management/treatment   5)  Return in about 1 month (around 01/20/2018) for wt check.    Vena Austria, MD, [facility], [facility] 12/23/2017, 10:14 AM

## 2018-01-05 ENCOUNTER — Other Ambulatory Visit: Payer: Self-pay | Admitting: Obstetrics and Gynecology

## 2018-01-05 MED ORDER — LORCASERIN HCL ER 20 MG PO TB24: 1.0000 | ORAL_TABLET | Freq: Every day | ORAL | 2 refills | Status: DC

## 2018-01-05 NOTE — Progress Notes (Signed)
Phentermine decreasing appetite suppression with decreasing weight loss last visit will switch to Belviq.  Previously tried cont rave and diethylpropion

## 2018-01-26 ENCOUNTER — Ambulatory Visit: Payer: BC Managed Care – PPO | Admitting: Obstetrics and Gynecology

## 2020-03-08 ENCOUNTER — Ambulatory Visit (INDEPENDENT_AMBULATORY_CARE_PROVIDER_SITE_OTHER): Payer: BC Managed Care – PPO | Admitting: Obstetrics and Gynecology

## 2020-03-08 ENCOUNTER — Encounter: Payer: Self-pay | Admitting: Obstetrics and Gynecology

## 2020-03-08 ENCOUNTER — Other Ambulatory Visit (HOSPITAL_COMMUNITY)
Admission: RE | Admit: 2020-03-08 | Discharge: 2020-03-08 | Disposition: A | Discharge status: Home / Self Care | Payer: BC Managed Care – PPO | Source: Ambulatory Visit | Attending: Obstetrics and Gynecology | Admitting: Obstetrics and Gynecology

## 2020-03-08 ENCOUNTER — Other Ambulatory Visit: Payer: Self-pay

## 2020-03-08 VITALS — BP 141/85 | HR 102 | Ht 65.0 in | Wt 217.0 lb

## 2020-03-08 DIAGNOSIS — Z124 Encounter for screening for malignant neoplasm of cervix: Secondary | ICD-10-CM | POA: Insufficient documentation

## 2020-03-08 DIAGNOSIS — Z1239 Encounter for other screening for malignant neoplasm of breast: Secondary | ICD-10-CM | POA: Diagnosis not present

## 2020-03-08 DIAGNOSIS — Z30432 Encounter for removal of intrauterine contraceptive device: Secondary | ICD-10-CM | POA: Diagnosis not present

## 2020-03-08 DIAGNOSIS — Z01419 Encounter for gynecological examination (general) (routine) without abnormal findings: Secondary | ICD-10-CM

## 2020-03-08 NOTE — Progress Notes (Signed)
Gynecology Annual Exam   PCP: [facility], [place]  Chief Complaint:  Chief Complaint  Patient presents with  . Gynecologic Exam  . Contraception    History of Present Illness: Patient is a 29 y.o. [id number] presents for annual exam. The patient has no complaints today.   LMP: Patient's last menstrual period was 02/20/2020 (exact date).  The patient is sexually active. She currently uses IUD for contraception. She denies dyspareunia.  The patient does perform self breast exams.  There is no notable family history of breast or ovarian cancer in her family.  The patient wears seatbelts: yes.   The patient has regular exercise: not asked.    The patient denies current symptoms of depression.    Review of Systems: Review of Systems  Constitutional: Negative for chills and fever.  HENT: Negative for congestion.   Respiratory: Negative for cough and shortness of breath.   Cardiovascular: Negative for chest pain and palpitations.  Gastrointestinal: Negative for abdominal pain, constipation, diarrhea, heartburn, nausea and vomiting.  Genitourinary: Negative for dysuria, frequency and urgency.  Skin: Negative for itching and rash.  Neurological: Negative for dizziness and headaches.  Endo/Heme/Allergies: Negative for polydipsia.  Psychiatric/Behavioral: Negative for depression.    Past Medical History:  Patient Active Problem List   Diagnosis Date Noted  . Vaginal birth after cesarean section 02/19/2016  . Postpartum care following vaginal delivery 03/31/2013  . Acute blood loss anemia 03/31/2013    Past Surgical History:  Past Surgical History:  Procedure Laterality Date  . CESAREAN SECTION N/A 03/30/2013   Procedure: Primary Cesarean Section Delivery Baby boy @ 2321, Apgars 9/9;  Surgeon: Robley Fries, MD;  Location: [facility];  Service: Obstetrics;  Laterality: N/A;  . CHOLECYSTECTOMY    . WISDOM TOOTH EXTRACTION      Gynecologic History:  Patient's last  menstrual period was 02/20/2020 (exact date). Contraception: IUD  Obstetric History: G2P2002  Family History:  History reviewed. No pertinent family history.  Social History:  Social History   Socioeconomic History  . Marital status: Married    Spouse name: Not on file  . Number of children: Not on file  . Years of education: Not on file  . Highest education level: Not on file  Occupational History  . Not on file  Tobacco Use  . Smoking status: Never Smoker  . Smokeless tobacco: Never Used  Vaping Use  . Vaping Use: Never used  Substance and Sexual Activity  . Alcohol use: No  . Drug use: No  . Sexual activity: Yes    Birth control/protection: I.U.D.    Comment: removing 03/08/20  Other Topics Concern  . Not on file  Social History Narrative  . Not on file   Social Determinants of Health   Financial Resource Strain:   . Difficulty of Paying Living Expenses: Not on file  Food Insecurity:   . Worried About Programme researcher, broadcasting/film/video in the Last Year: Not on file  . Ran Out of Food in the Last Year: Not on file  Transportation Needs:   . Lack of Transportation (Medical): Not on file  . Lack of Transportation (Non-Medical): Not on file  Physical Activity:   . Days of Exercise per Week: Not on file  . Minutes of Exercise per Session: Not on file  Stress:   . Feeling of Stress : Not on file  Social Connections:   . Frequency of Communication with Friends and Family: Not on file  .  Frequency of Social Gatherings with Friends and Family: Not on file  . Attends Religious Services: Not on file  . Active Member of Clubs or Organizations: Not on file  . Attends Banker Meetings: Not on file  . Marital Status: Not on file  Intimate Partner Violence:   . Fear of Current or Ex-Partner: Not on file  . Emotionally Abused: Not on file  . Physically Abused: Not on file  . Sexually Abused: Not on file    Allergies:  No Known Allergies  Medications: Prior to  Admission medications   Medication Sig Start Date End Date Taking? Authorizing Provider  levonorgestrel (MIRENA) 20 MCG/24HR IUD 1 each by Intrauterine route once. Patient not taking: Reported on 03/08/2020    [provider]  Lorcaserin HCl ER (BELVIQ XR) 20 MG TB24 Take 1 tablet by mouth daily. Patient not taking: Reported on 03/08/2020 01/05/18   Vena Austria, MD    Physical Exam Vitals: Blood pressure (!) 141/85, pulse (!) 102, height 5\' 5"  (1.651 m), weight 217 lb (98.4 kg), last menstrual period 02/20/2020, SpO2 98 %. Body mass index is 36.11 kg/m.  General: NAD HEENT: normocephalic, anicteric Thyroid: no enlargement, no palpable nodules Pulmonary: No increased work of breathing, CTAB Cardiovascular: RRR, distal pulses 2+ Breast: Breast symmetrical, no tenderness, no palpable nodules or masses, no skin or nipple retraction present, no nipple discharge.  No axillary or supraclavicular lymphadenopathy. Abdomen: NABS, soft, non-tender, non-distended.  Umbilicus without lesions.  No hepatomegaly, splenomegaly or masses palpable. No evidence of hernia  Genitourinary:  External: Normal external female genitalia.  Normal urethral meatus, normal Bartholin's and Skene's glands.    Vagina: Normal vaginal mucosa, no evidence of prolapse.    Cervix: Grossly normal in appearance, no bleeding, IUD strings visualized  Uterus: Non-enlarged, mobile, normal contour.  No CMT  Adnexa: ovaries non-enlarged, no adnexal masses  Rectal: deferred  Lymphatic: no evidence of inguinal lymphadenopathy Extremities: no edema, erythema, or tenderness Neurologic: Grossly intact Psychiatric: mood appropriate, affect full  Female chaperone present for pelvic and breast  portions of the physical exam    GYNECOLOGY OFFICE PROCEDURE NOTE  ALIANA KREISCHER is a 29 y.o. [phone] here for Mirena IUD removal. She desires removal secondary to desire to conceive..  IUD Removal  Patient identified,  informed consent performed, consent signed.  Patient was in the dorsal lithotomy position, normal external genitalia was noted.  A speculum was placed in the patient's vagina, normal discharge was noted, no lesions. The cervix was visualized, no lesions, no abnormal discharge.  The strings of the IUD were grasped and pulled using ring forceps. The IUD was removed in its entirety. Patient tolerated the procedure well.    Patient plans for pregnancy soon and she was told to avoid teratogens, take PNV and folic acid.  Routine preventative health maintenance measures emphasized.   Immunization History  Administered Date(s) Administered  . Influenza,inj,Quad PF,6+ Mos 02/19/2016  . Influenza-Unspecified 03/01/2020  . Moderna SARS-COVID-2 Vaccination 03/01/2020  . Varicella 02/19/2016     Assessment: 29 y.o. G2P2002 routine annual exam  Plan: Problem List Items Addressed This Visit    None    Visit Diagnoses    Encounter for gynecological examination without abnormal finding    -  Primary   Screening for malignant neoplasm of cervix       Relevant Orders   Cytology - PAP   Breast screening       Encounter for IUD removal  1) STI screening  was notoffered and therefore not obtained  2)  ASCCP guidelines and rational discussed.  Patient opts for every 3 years screening interval  3) Contraception - the patient is currently using  IUD which was removed today.  She is attempting to conceive in the near future  4) Routine healthcare maintenance including cholesterol, diabetes screening discussed managed by PCP  5) No follow-ups on file.   Vena Austria, MD, [facility], [facility] 03/08/2020, 2:51 PM

## 2020-03-10 LAB — CYTOLOGY - PAP: Diagnosis: NEGATIVE

## 2020-06-15 ENCOUNTER — Ambulatory Visit (INDEPENDENT_AMBULATORY_CARE_PROVIDER_SITE_OTHER): Payer: Managed Care, Other (non HMO) | Admitting: Obstetrics and Gynecology

## 2020-06-15 ENCOUNTER — Other Ambulatory Visit: Payer: Self-pay

## 2020-06-15 ENCOUNTER — Other Ambulatory Visit (HOSPITAL_COMMUNITY)
Admission: RE | Admit: 2020-06-15 | Discharge: 2020-06-15 | Disposition: A | Discharge status: Home / Self Care | Payer: Managed Care, Other (non HMO) | Source: Ambulatory Visit | Attending: Obstetrics and Gynecology | Admitting: Obstetrics and Gynecology

## 2020-06-15 ENCOUNTER — Encounter: Payer: Self-pay | Admitting: Obstetrics and Gynecology

## 2020-06-15 VITALS — BP 132/79 | HR 70 | Wt 228.4 lb

## 2020-06-15 DIAGNOSIS — N926 Irregular menstruation, unspecified: Secondary | ICD-10-CM

## 2020-06-15 DIAGNOSIS — O99210 Obesity complicating pregnancy, unspecified trimester: Secondary | ICD-10-CM | POA: Diagnosis present

## 2020-06-15 DIAGNOSIS — Z3480 Encounter for supervision of other normal pregnancy, unspecified trimester: Secondary | ICD-10-CM | POA: Insufficient documentation

## 2020-06-15 DIAGNOSIS — Z8759 Personal history of other complications of pregnancy, childbirth and the puerperium: Secondary | ICD-10-CM

## 2020-06-15 DIAGNOSIS — O0990 Supervision of high risk pregnancy, unspecified, unspecified trimester: Secondary | ICD-10-CM | POA: Insufficient documentation

## 2020-06-15 DIAGNOSIS — Z113 Encounter for screening for infections with a predominantly sexual mode of transmission: Secondary | ICD-10-CM

## 2020-06-15 DIAGNOSIS — Z3689 Encounter for other specified antenatal screening: Secondary | ICD-10-CM

## 2020-06-15 DIAGNOSIS — O34219 Maternal care for unspecified type scar from previous cesarean delivery: Secondary | ICD-10-CM

## 2020-06-15 LAB — POCT URINE PREGNANCY: Preg Test, Ur: POSITIVE — AB

## 2020-06-15 MED ORDER — OMEPRAZOLE 20 MG PO CPDR: 20.0000 mg | DELAYED_RELEASE_CAPSULE | Freq: Every day | ORAL | 3 refills | Status: AC

## 2020-06-15 NOTE — Patient Instructions (Signed)
This is a very exciting time for you and your family, so congratulations from everybody here at [facility]! You have just embarked on a very amazing journey. The next several months will be filled with wondrous emotions and miraculous memories. As you begin your preparations, this office wanted you to be aware of a few prenatal genetic laboratory tests that are available to you early in your pregnancy. These tests are optional and you may decide to opt out of the testing.   Patients often voice their desire to opt out of this testing as it would not change their decisions on continuing the pregnancy.  However, our providers value the information they receive from these tests as they allow us to optimize the care for you and your baby prior to delivery.    There are 6 genetic laboratory tests this office offers, and they test for a variety of different genetic diseases or chromosomal abnormalities. By utilizing these tests, the providers can better understand your risk associated with passing on a genetic condition to your child. These tests are screening tests, and are not used to diagnose any condition. If one of these tests results abnormal, then a diagnostic test will be offered to you. It is important to remember that most pregnancies will result in a beautiful and healthy baby. Knowing the results of these tests will also help you better prepare for your delivery.  We encourage you to read over the brief descriptions below and engage with your provider regarding any additional questions you may have.  Please also make your provider aware if you or your partner has any Jewish ancestry as there may be additional testing that you qualify for. The tests that will be ordered are: 1. Cystic Fibrosis Carrier Screen1 (Blood Test): The most common autosomal recessive disorder. This disease causes thick mucus to build up in the lungs, which leads to repetitive chest infections. The carrier frequency in caucasians is  roughly 1 in 30 people in the [place], but varies by ethnicity.   2. Spinal Muscular Atrophy Carrier Screen1 (Blood Test):  The second most common autosomal recessive disorder and the most common inherited form of early childhood death. This is degenerative neuromuscular condition that affects the child's ability to sit, smile, breath, swallow, etc. The carrier frequency is the [place] is roughly 1 in 54, but varies by ethnicity. 3. Fragile X Syndrome Carrier Screen1 (Blood Test): The most common form of inherited mental retardation. The severity of the retardation varies. Fragile X is most commonly passed from mother to child, and 1 in 260 people in the [place] are carriers. 4. Downs Syndrome, Trisomy 18: Trisomy 21 ( Downs Syndrome) and Trisomy 18 are common forms of mental retardation due to chromosomal abnormalities. Downs is the milder of the two, and it occurs 1 in 700 live births. Trisomy 18 is a more severe form of mental retardation, and occurs in 1 in 6000 live births. The risk of conceiving a baby with one of these two genetic conditions is independent of family history.  The test offered to you will depend on how far along you are in your pregnancy.  The main risk factor that has been identified as predisposing to either trisomy 21 or trisomy 18 is the age of the mother, with increased risk for those patients over the age of 35 at the time of delivery.  If you are deemed high risk on the initial screening test or are over 35 you may be a   candidate for cell free fetal DNA testing or amniocentesis outlined below.  See #6, 7, 8, and 9 for available testing options 5.  MSAFP2 Maternal Serum Alpha Feto-Protein (Blood Test):  Open Neural Tube Defects deal with an opening in the spinal column that does not close properly during pregnancy. It occurs when the tissues that fold to form the neural tube do not close or do not stay closed completely. This causes an opening in the vertebrae,  which surround and protect the spinal cord. The most common form is Spina Bifida. It occurs in 1 in 1000 live births. The folic acid in prenatal vitamins can decrease the risk of you baby developing this birth defect.  If you have had a prior pregnancy complicated by spina bifida you may need higher doses of folic acid.   6. 1st trimester screening2 (Blood Test):  this test is a combination of an ultrasound measurement of your baby's neck and blood work ideally obtained between 11 weeks and 13 weeks 6 days gestation.  It tests for #4 above 7. Tetra Screen2 (Blood Test):  this is a combined blood test offered at 15 to [redacted] weeks gestation.  It tests for conditions #4 and #5 above 8. Cell Free Fetal DNA2 (Blood Test):  this test is available for our patient over 35 or patient who were found to be at increased risk for down syndrome or trisomy 18 based on either 1st trimester screening results or serum tetra screening.  At present this testing is still considered screening rather than diagnostic 9. Amniocentesis - this test is available for our patient over 35 or patient who were found to be at increased risk for down syndrome or trisomy 18 based on either 1st trimester screening results or serum tetra screening.  This test involves using a needle to draw off some of the fluid from around the baby in order to determine if the fetus is affected by either trisomy 21 (Downs Syndrome) or trisomy 18.  In addition this test may be suggested or offered by your provider in other circumstances.  This test is considered diagnostic as opposed to screening meaning that it can definitively rule in or rule out the tested condition.  Unlike the other testing discussed amniocentesis is an invasive procedure and is associated with a small risk (approximately 1 in 200) of resulting in a miscarriage. 1. It is also important to notate that if you screen positive for carrier status for Cystic Fibrosis or Spinal Muscular Atrophy, that  does not mean your child will be affected with the condition. The child's father will also need to be tested. If both of you are carriers, then there is a 25% that you will have an affected child. The risk of having a child affected with Downs Syndrome or Trisomy 18 varies by maternal age. There is not a "carrier" status for these conditions. If you would like more information of these conditions, please see the handouts in your packet of information. 2. Denotes screening tests.   These tests can assess the risk of the pregnancy being affected by a particular condition.  It is important to note that even if testing deems that you are at increased risk your baby may still be unaffected.  Conversely, test results indicating that your baby is at low risk for the tested condition does not rule out that the baby could still be affected by that condition  

## 2020-06-15 NOTE — Progress Notes (Signed)
New Obstetric Patient H&P    Chief Complaint: "Desires prenatal care"   History of Present Illness: Patient is a 30 y.o. [id number] Not Hispanic or Latino female, presents with amenorrhea and positive home pregnancy test. No LMP recorded. (Menstrual status: IUD). and based on her  LMP, her EDD is Estimated Date of Delivery: 01/24/21 and her EGA is [redacted]w[redacted]d. Cycles are regular monthly. Her last pap smear was 03/08/2020 NILM.     She had a urine pregnancy test which was positive 3 week(s)  ago. Her last menstrual period was normal. Since her LMP she claims she has experienced some mild nausea, GERD symptoms, breast tenderness and fatigue. She denies vaginal bleeding. Her past medical history is noncontributory. Her prior pregnancies are notable for cesarean section with G1 secondary to OP presentation and 2nd stage arrest 7lbs 7oz fetus, gestational hypertension G1, and successful VBAC G2 8lbs 2.9oz fetus.  Since her LMP, she admits to the use of tobacco products  no There are cats in the home in the home  no  She admits close contact with children on a regular basis  yes  She has had chicken pox in the past no She has had Tuberculosis exposures, symptoms, or previously tested positive for TB   no Current or past history of domestic violence. no  Genetic Screening/Teratology Counseling: (Includes patient, baby's father, or anyone in either family with:)   1. Patient's age >/= 42 at [employer]  no 2. Thalassemia ([place], [place], Mediterranean, or Asian background): MCV<80  no 3. Neural tube defect (meningomyelocele, spina bifida, anencephaly)  no 4. Congenital heart defect  no  5. Down syndrome  no 6. Tay-Sachs (Jewish, [place]  no 7. Canavan's Disease  no 8. Sickle cell disease or trait (African)  no  9. Hemophilia or other blood disorders  no  10. Muscular dystrophy  no  11. Cystic fibrosis  no  12. Huntington's Chorea  no  13. Mental retardation/autism  no 14. Other inherited genetic or  chromosomal disorder  no 15. Maternal metabolic disorder (DM, PKU, etc)  no 16. Patient or FOB with a child with a birth defect not listed above no  16a. Patient or FOB with a birth defect themselves no 17. Recurrent pregnancy loss, or stillbirth  no  18. Any medications since LMP other than prenatal vitamins (include vitamins, supplements, OTC meds, drugs, alcohol)  no 19. Any other genetic/environmental exposure to discuss  no  Infection History:   1. Lives with someone with TB or TB exposed  no  2. Patient or partner has history of genital herpes  no 3. Rash or viral illness since LMP  no 4. History of STI (GC, CT, HPV, syphilis, HIV)  no 5. History of recent travel :  no  Other pertinent information:  no     Review of Systems:10 point review of systems negative unless otherwise noted in HPI  Past Medical History:  Patient Active Problem List   Diagnosis Date Noted  . Vaginal birth after cesarean section 02/19/2016  . Postpartum care following vaginal delivery 03/31/2013  . Acute blood loss anemia 03/31/2013    Past Surgical History:  Past Surgical History:  Procedure Laterality Date  . CESAREAN SECTION N/A 03/30/2013   Procedure: Primary Cesarean Section Delivery Baby boy @ 2321, Apgars 9/9;  Surgeon: Robley Fries, MD;  Location: [facility];  Service: Obstetrics;  Laterality: N/A;  . CHOLECYSTECTOMY    . WISDOM TOOTH EXTRACTION      Gynecologic  History: No LMP recorded. (Menstrual status: IUD).  Obstetric History: [id number]  Family History:  No family history on file.  Social History:  Social History   Socioeconomic History  . Marital status: Married    Spouse name: Not on file  . Number of children: Not on file  . Years of education: Not on file  . Highest education level: Not on file  Occupational History  . Not on file  Tobacco Use  . Smoking status: Never Smoker  . Smokeless tobacco: Never Used  Vaping Use  . Vaping Use: Never used  Substance and  Sexual Activity  . Alcohol use: No  . Drug use: No  . Sexual activity: Yes    Birth control/protection: I.U.D.    Comment: removing 03/08/20  Other Topics Concern  . Not on file  Social History Narrative  . Not on file   Social Determinants of Health   Financial Resource Strain: Not on file  Food Insecurity: Not on file  Transportation Needs: Not on file  Physical Activity: Not on file  Stress: Not on file  Social Connections: Not on file  Intimate Partner Violence: Not on file    Allergies:  No Known Allergies  Medications: Prior to Admission medications   Medication Sig Start Date End Date Taking? Authorizing Provider  levonorgestrel (MIRENA) 20 MCG/24HR IUD 1 each by Intrauterine route once. Patient not taking: Reported on 03/08/2020    [provider]  Lorcaserin HCl ER (BELVIQ XR) 20 MG TB24 Take 1 tablet by mouth daily. Patient not taking: Reported on 03/08/2020 01/05/18   Vena Austria, MD    Physical Exam Vitals: Blood pressure 132/79, pulse 70, weight 228 lb 6 oz (103.6 kg), last menstrual period 04/19/2020. Body mass index is 38 kg/m.  General: NAD HEENT: normocephalic, anicteric Thyroid: no enlargement, no palpable nodules Pulmonary: No increased work of breathing, CTAB Cardiovascular: RRR, distal pulses 2+ Abdomen: NABS, soft, non-tender, non-distended.  Umbilicus without lesions.  No hepatomegaly, splenomegaly or masses palpable. No evidence of hernia  Genitourinary:  External: Normal external female genitalia.  Normal urethral meatus, normal  Bartholin's and Skene's glands.    Vagina: Normal vaginal mucosa, no evidence of prolapse.    Cervix: Grossly normal in appearance, no bleeding  Uterus:  Non-enlarged, mobile, normal contour.  No CMT  Adnexa: ovaries non-enlarged, no adnexal masses  Rectal: deferred Extremities: no edema, erythema, or tenderness Neurologic: Grossly intact Psychiatric: mood appropriate, affect full   Assessment:  30 y.o. G2P2002 at Unknown presenting to initiate prenatal care  Plan: 1) Avoid alcoholic beverages. 2) Patient encouraged not to smoke.  3) Discontinue the use of all non-medicinal drugs and chemicals.  4) Take prenatal vitamins daily.  5) Nutrition, food safety (fish, cheese advisories, and high nitrite foods) and exercise discussed. 6) Hospital and practice style discussed with cross coverage system.  7) Genetic Screening, such as with 1st Trimester Screening, cell free fetal DNA, AFP testing, and Ultrasound, as well as with amniocentesis and CVS as appropriate, is discussed with patient. At the conclusion of today's visit patient undecided genetic testing 8) Dating scan ordered 9) Return in about 1 year (around 06/15/2021) for ROB and growth scan.   Vena Austria, MD, [facility], [facility] 06/15/2020, 9:06 AM

## 2020-06-16 LAB — HEMOGLOBIN A1C
Est. average glucose Bld gHb Est-mCnc: 94 mg/dL
Hgb A1c MFr Bld: 4.9 % (ref 4.8–5.6)

## 2020-06-16 LAB — COMPREHENSIVE METABOLIC PANEL
ALT: 41 IU/L — ABNORMAL HIGH (ref 0–32)
AST: 20 IU/L (ref 0–40)
Albumin/Globulin Ratio: 2.1 (ref 1.2–2.2)
Albumin: 4.6 g/dL (ref 3.9–5.0)
Alkaline Phosphatase: 69 IU/L (ref 44–121)
BUN/Creatinine Ratio: 10 (ref 9–23)
BUN: 7 mg/dL (ref 6–20)
Bilirubin Total: 0.5 mg/dL (ref 0.0–1.2)
CO2: 23 mmol/L (ref 20–29)
Calcium: 9 mg/dL (ref 8.7–10.2)
Chloride: 104 mmol/L (ref 96–106)
Creatinine, Ser: 0.71 mg/dL (ref 0.57–1.00)
GFR calc Af Amer: 135 mL/min/{1.73_m2} (ref >59)
GFR calc non Af Amer: 117 mL/min/{1.73_m2} (ref >59)
Globulin, Total: 2.2 g/dL (ref 1.5–4.5)
Glucose: 72 mg/dL (ref 65–99)
Potassium: 4.2 mmol/L (ref 3.5–5.2)
Sodium: 139 mmol/L (ref 134–144)
Total Protein: 6.8 g/dL (ref 6.0–8.5)

## 2020-06-16 LAB — PROTEIN / CREATININE RATIO, URINE
Creatinine, Urine: 155.7 mg/dL
Protein, Ur: 11.1 mg/dL
Protein/Creat Ratio: 71 mg/g creat (ref 0–200)

## 2020-06-16 LAB — RPR+RH+ABO+RUB AB+AB SCR+CB...
Antibody Screen: NEGATIVE
HIV Screen 4th Generation wRfx: NONREACTIVE
Hematocrit: 44.3 % (ref 34.0–46.6)
Hemoglobin: 15 g/dL (ref 11.1–15.9)
Hepatitis B Surface Ag: NEGATIVE
MCH: 31.2 pg (ref 26.6–33.0)
MCHC: 33.9 g/dL (ref 31.5–35.7)
MCV: 92 fL (ref 79–97)
Platelets: 224 10*3/uL (ref 150–450)
RBC: 4.81 x10E6/uL (ref 3.77–5.28)
RDW: 12.3 % (ref 11.7–15.4)
RPR Ser Ql: NONREACTIVE
Rh Factor: POSITIVE
Rubella Antibodies, IGG: 1.92 index (ref >0.99)
Varicella zoster IgG: 374 index (ref >165)
WBC: 9.4 10*3/uL (ref 3.4–10.8)

## 2020-06-17 LAB — URINE CULTURE

## 2020-06-18 ENCOUNTER — Emergency Department: Payer: Managed Care, Other (non HMO)

## 2020-06-18 ENCOUNTER — Emergency Department
Admission: EM | Admit: 2020-06-18 | Discharge: 2020-06-18 | Disposition: A | Discharge status: Home / Self Care | Payer: Managed Care, Other (non HMO) | Attending: Emergency Medicine | Admitting: Emergency Medicine

## 2020-06-18 ENCOUNTER — Encounter: Payer: Self-pay | Admitting: Emergency Medicine

## 2020-06-18 ENCOUNTER — Other Ambulatory Visit: Payer: Self-pay

## 2020-06-18 DIAGNOSIS — O9A211 Injury, poisoning and certain other consequences of external causes complicating pregnancy, first trimester: Secondary | ICD-10-CM | POA: Insufficient documentation

## 2020-06-18 DIAGNOSIS — S20319A Abrasion of unspecified front wall of thorax, initial encounter: Secondary | ICD-10-CM | POA: Diagnosis not present

## 2020-06-18 DIAGNOSIS — Z3A01 Less than 8 weeks gestation of pregnancy: Secondary | ICD-10-CM | POA: Diagnosis not present

## 2020-06-18 DIAGNOSIS — O4691 Antepartum hemorrhage, unspecified, first trimester: Secondary | ICD-10-CM | POA: Insufficient documentation

## 2020-06-18 DIAGNOSIS — Z3491 Encounter for supervision of normal pregnancy, unspecified, first trimester: Secondary | ICD-10-CM

## 2020-06-18 DIAGNOSIS — Y92410 Unspecified street and highway as the place of occurrence of the external cause: Secondary | ICD-10-CM | POA: Insufficient documentation

## 2020-06-18 DIAGNOSIS — O131 Gestational [pregnancy-induced] hypertension without significant proteinuria, first trimester: Secondary | ICD-10-CM | POA: Insufficient documentation

## 2020-06-18 DIAGNOSIS — R1031 Right lower quadrant pain: Secondary | ICD-10-CM

## 2020-06-18 DIAGNOSIS — R1032 Left lower quadrant pain: Secondary | ICD-10-CM

## 2020-06-18 DIAGNOSIS — R319 Hematuria, unspecified: Secondary | ICD-10-CM

## 2020-06-18 LAB — URINALYSIS, COMPLETE (UACMP) WITH MICROSCOPIC
Bacteria, UA: NONE SEEN
Bilirubin Urine: NEGATIVE
Glucose, UA: NEGATIVE mg/dL
Ketones, ur: 5 mg/dL — AB
Leukocytes,Ua: NEGATIVE
Nitrite: NEGATIVE
Protein, ur: NEGATIVE mg/dL
Specific Gravity, Urine: 1.018 (ref 1.005–1.030)
pH: 5 (ref 5.0–8.0)

## 2020-06-18 MED ORDER — ACETAMINOPHEN 325 MG PO TABS
650.0000 mg | ORAL_TABLET | Freq: Once | ORAL | Status: AC
  Administered 2020-06-18: 650 mg via ORAL
  Filled 2020-06-18: qty 2
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
## 2020-06-18 NOTE — Discharge Instructions (Addendum)
Follow-up with your OB/GYN if any continued problems or concerns.  Return to the emergency department if any severe worsening of your symptoms or urgent concerns over the weekend.  You may take Tylenol for soreness.  Do not take anti-inflammatories.  You may also use moist heat or ice to your muscles as needed for discomfort.  You will be more sore tomorrow than you are currently.

## 2020-06-18 NOTE — ED Notes (Signed)
Patient complaining of worsening headache.  Provider notified.

## 2020-06-19 LAB — CERVICOVAGINAL ANCILLARY ONLY
Chlamydia: NEGATIVE
Comment: NEGATIVE
Comment: NORMAL
Neisseria Gonorrhea: NEGATIVE

## 2020-06-21 ENCOUNTER — Other Ambulatory Visit: Payer: Self-pay

## 2020-06-21 ENCOUNTER — Ambulatory Visit (INDEPENDENT_AMBULATORY_CARE_PROVIDER_SITE_OTHER): Payer: Managed Care, Other (non HMO)

## 2020-06-21 ENCOUNTER — Encounter: Payer: Self-pay | Admitting: Advanced Practice Midwife

## 2020-06-21 ENCOUNTER — Ambulatory Visit (INDEPENDENT_AMBULATORY_CARE_PROVIDER_SITE_OTHER): Payer: Managed Care, Other (non HMO) | Admitting: Advanced Practice Midwife

## 2020-06-21 ENCOUNTER — Other Ambulatory Visit: Payer: Self-pay | Admitting: Obstetrics and Gynecology

## 2020-06-21 VITALS — BP 130/80 | Wt 228.0 lb

## 2020-06-21 DIAGNOSIS — Z3480 Encounter for supervision of other normal pregnancy, unspecified trimester: Secondary | ICD-10-CM

## 2020-06-21 DIAGNOSIS — Z3689 Encounter for other specified antenatal screening: Secondary | ICD-10-CM

## 2020-06-21 DIAGNOSIS — Z3A01 Less than 8 weeks gestation of pregnancy: Secondary | ICD-10-CM

## 2020-06-21 DIAGNOSIS — O0991 Supervision of high risk pregnancy, unspecified, first trimester: Secondary | ICD-10-CM

## 2020-06-21 LAB — POCT URINALYSIS DIPSTICK OB
Glucose, UA: NEGATIVE
POC,PROTEIN,UA: NEGATIVE
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
## 2020-06-27 ENCOUNTER — Other Ambulatory Visit: Payer: Self-pay | Admitting: Obstetrics and Gynecology

## 2020-06-27 DIAGNOSIS — O209 Hemorrhage in early pregnancy, unspecified: Secondary | ICD-10-CM

## 2020-06-27 DIAGNOSIS — O0990 Supervision of high risk pregnancy, unspecified, unspecified trimester: Secondary | ICD-10-CM

## 2020-06-27 NOTE — Telephone Encounter (Signed)
Can we get her in sometime this week for ROB and ultrasound.  I have placed the ultrasound ordered

## 2020-06-27 NOTE — Telephone Encounter (Signed)
Patient is scheduled for 8:30 u/s and follow up on 06/29/20 with AMS

## 2020-06-29 ENCOUNTER — Other Ambulatory Visit: Payer: Self-pay

## 2020-06-29 ENCOUNTER — Ambulatory Visit (INDEPENDENT_AMBULATORY_CARE_PROVIDER_SITE_OTHER): Payer: Managed Care, Other (non HMO) | Admitting: Obstetrics and Gynecology

## 2020-06-29 ENCOUNTER — Ambulatory Visit (INDEPENDENT_AMBULATORY_CARE_PROVIDER_SITE_OTHER): Payer: Managed Care, Other (non HMO)

## 2020-06-29 VITALS — BP 118/72 | Wt 228.0 lb

## 2020-06-29 DIAGNOSIS — O0990 Supervision of high risk pregnancy, unspecified, unspecified trimester: Secondary | ICD-10-CM | POA: Diagnosis not present

## 2020-06-29 DIAGNOSIS — Z3A08 8 weeks gestation of pregnancy: Secondary | ICD-10-CM | POA: Diagnosis not present

## 2020-06-29 DIAGNOSIS — O209 Hemorrhage in early pregnancy, unspecified: Secondary | ICD-10-CM

## 2020-06-29 DIAGNOSIS — Z3A09 9 weeks gestation of pregnancy: Secondary | ICD-10-CM

## 2020-06-29 LAB — POCT URINALYSIS DIPSTICK OB
Glucose, UA: NEGATIVE
POC,PROTEIN,UA: NEGATIVE
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
## 2020-07-13 ENCOUNTER — Ambulatory Visit (INDEPENDENT_AMBULATORY_CARE_PROVIDER_SITE_OTHER): Payer: Managed Care, Other (non HMO) | Admitting: Obstetrics and Gynecology

## 2020-07-13 VITALS — BP 123/79 | Wt 229.0 lb

## 2020-07-13 DIAGNOSIS — O0990 Supervision of high risk pregnancy, unspecified, unspecified trimester: Secondary | ICD-10-CM

## 2020-07-13 DIAGNOSIS — Z8759 Personal history of other complications of pregnancy, childbirth and the puerperium: Secondary | ICD-10-CM

## 2020-07-13 DIAGNOSIS — O99210 Obesity complicating pregnancy, unspecified trimester: Secondary | ICD-10-CM

## 2020-07-13 DIAGNOSIS — O34219 Maternal care for unspecified type scar from previous cesarean delivery: Secondary | ICD-10-CM

## 2020-07-13 DIAGNOSIS — Z1379 Encounter for other screening for genetic and chromosomal anomalies: Secondary | ICD-10-CM

## 2020-07-13 DIAGNOSIS — Z3A11 11 weeks gestation of pregnancy: Secondary | ICD-10-CM

## 2020-07-13 LAB — POCT URINALYSIS DIPSTICK OB
Glucose, UA: NEGATIVE
POC,PROTEIN,UA: NEGATIVE

## 2020-07-13 NOTE — Progress Notes (Signed)
    Routine Prenatal Care Visit  Subjective  JOELEE Hunter is a 30 y.o. G3P2002 at [redacted]w[redacted]d being seen today for ongoing prenatal care.  She is currently monitored for the following issues for this low-risk pregnancy and has Acute blood loss anemia; History of successful vaginal birth after cesarean, currently pregnant; Maternal obesity, antepartum; History of gestational hypertension; and Supervision of high risk pregnancy, antepartum on their problem list.  ----------------------------------------------------------------------------------- Patient reports no complaints.   Contractions: Not present. Vag. Bleeding: None.  Movement: Absent. Denies leaking of fluid.  ----------------------------------------------------------------------------------- The following portions of the patient's history were reviewed and updated as appropriate: allergies, current medications, past family history, past medical history, past social history, past surgical history and problem list. Problem list updated.   Objective  Blood pressure 123/79, weight 229 lb (103.9 kg), last menstrual period 04/19/2020. Pregravid weight 217 lb (98.4 kg) Total Weight Gain 12 lb (5.443 kg)  Body mass index is 38.11 kg/m.  Urinalysis:      Fetal Status: Fetal Heart Rate (bpm): 165   Movement: Absent     General:  Alert, oriented and cooperative. Patient is in no acute distress.  Skin: Skin is warm and dry. No rash noted.   Cardiovascular: Normal heart rate noted  Respiratory: Normal respiratory effort, no problems with respiration noted  Abdomen: Soft, gravid, appropriate for gestational age. Pain/Pressure: Absent     Pelvic:  Cervical exam deferred        Extremities: Normal range of motion.     ental Status: Normal mood and affect. Normal behavior. Normal judgment and thought content.     Assessment   30 y.o. G3P2002 at [redacted]w[redacted]d by  02/01/2021, by Ultrasound presenting for routine prenatal visit  Plan   pregnancy 4  Problems (from 06/15/20 to present)    Problem Noted Resolved   Supervision of high risk pregnancy, antepartum 06/15/2020 by Vena Austria, MD No   Overview Signed 06/21/2020  9:32 AM by Tresea Mall, CNM    [facility]inic [facility] Prenatal Labs  Dating By 7w u/s Blood type: O/Positive/-- (01/27 1030)   Genetic Screen 1 Screen:    AFP:     Quad:     NIPS: Antibody:Negative (01/27 1030)  Anatomic [place]  Rubella: 1.92 (01/27 1030)  Varicella: @VZVIGG @  GTT Early: hgb a1c 4.9              Third trimester:  RPR: Non Reactive (01/27 1030)   Rhogam  HBsAg: Negative (01/27 1030)   Vaccines TDAP:                       Flu Shot: Covid: HIV: Non Reactive (01/27 1030)   Baby Food                                GBS:   GC/CT:  Contraception  Pap:  CBB     CS/VBAC    Support Person               Gestational age appropriate obstetric precautions including but not limited to vaginal bleeding, contractions, leaking of fluid and fetal movement were reviewed in detail with the patient.    - maternit21 today  Return in about 4 weeks (around 08/10/2020) for ROB.  08/12/2020, MD, [facility], [facility] 07/13/2020, 10:50 AM

## 2020-07-19 ENCOUNTER — Encounter: Payer: Managed Care, Other (non HMO) | Admitting: Obstetrics and Gynecology

## 2020-07-19 LAB — MATERNIT 21 PLUS CORE, BLOOD
Fetal Fraction: 9
Result (T21): NEGATIVE
Trisomy 13 (Patau syndrome): NEGATIVE
Trisomy 18 (Edwards syndrome): NEGATIVE
Trisomy 21 (Down syndrome): NEGATIVE

## 2020-08-10 ENCOUNTER — Ambulatory Visit (INDEPENDENT_AMBULATORY_CARE_PROVIDER_SITE_OTHER): Payer: Managed Care, Other (non HMO) | Admitting: Obstetrics and Gynecology

## 2020-08-10 ENCOUNTER — Other Ambulatory Visit: Payer: Self-pay

## 2020-08-10 VITALS — BP 104/75 | HR 78 | Wt 233.0 lb

## 2020-08-10 DIAGNOSIS — Z3A15 15 weeks gestation of pregnancy: Secondary | ICD-10-CM

## 2020-08-10 DIAGNOSIS — O0990 Supervision of high risk pregnancy, unspecified, unspecified trimester: Secondary | ICD-10-CM

## 2020-08-10 DIAGNOSIS — O99210 Obesity complicating pregnancy, unspecified trimester: Secondary | ICD-10-CM

## 2020-08-10 DIAGNOSIS — O34219 Maternal care for unspecified type scar from previous cesarean delivery: Secondary | ICD-10-CM

## 2020-08-10 DIAGNOSIS — Z8759 Personal history of other complications of pregnancy, childbirth and the puerperium: Secondary | ICD-10-CM

## 2020-08-10 DIAGNOSIS — Z363 Encounter for antenatal screening for malformations: Secondary | ICD-10-CM

## 2020-08-10 LAB — POCT URINALYSIS DIPSTICK OB
Glucose, UA: NEGATIVE
POC,PROTEIN,UA: NEGATIVE

## 2020-08-10 NOTE — Progress Notes (Signed)
    Routine Prenatal Care Visit  Subjective  Kendra Hunter is a 30 y.o. G3P2002 at [redacted]w[redacted]d being seen today for ongoing prenatal care.  She is currently monitored for the following issues for this low-risk pregnancy and has Acute blood loss anemia; History of successful vaginal birth after cesarean, currently pregnant; Maternal obesity, antepartum; History of gestational hypertension; and Supervision of high risk pregnancy, antepartum on their problem list.  ----------------------------------------------------------------------------------- Patient reports no complaints.  Still having some food aversions and sensitivity to smells. Contractions: Not present. Vag. Bleeding: None.  Movement: Absent. Denies leaking of fluid.  ----------------------------------------------------------------------------------- The following portions of the patient's history were reviewed and updated as appropriate: allergies, current medications, past family history, past medical history, past social history, past surgical history and problem list. Problem list updated.   Objective  Blood pressure 104/75, pulse 78, weight 233 lb (105.7 kg), last menstrual period 04/19/2020. Pregravid weight 217 lb (98.4 kg) Total Weight Gain 16 lb (7.258 kg)  Body mass index is 38.77 kg/m.  Urinalysis:      Fetal Status: Fetal Heart Rate (bpm): 150   Movement: Absent     General:  Alert, oriented and cooperative. Patient is in no acute distress.  Skin: Skin is warm and dry. No rash noted.   Cardiovascular: Normal heart rate noted  Respiratory: Normal respiratory effort, no problems with respiration noted  Abdomen: Soft, gravid, appropriate for gestational age. Pain/Pressure: Absent     Pelvic:  Cervical exam deferred        Extremities: Normal range of motion.     ental Status: Normal mood and affect. Normal behavior. Normal judgment and thought content.     Assessment   30 y.o. G3P2002 at [redacted]w[redacted]d by  02/01/2021, by  Ultrasound presenting for routine prenatal visit  Plan   pregnancy 4 Problems (from 06/15/20 to present)    Problem Noted Resolved   Supervision of high risk pregnancy, antepartum 06/15/2020 by Vena Austria, MD No   Overview Addendum 07/21/2020  1:37 PM by Vena Austria, MD    [facility]inic [facility] Prenatal Labs  Dating By 7w u/s Blood type: O/Positive/-- (01/27 1030)   Genetic Screen NIPS: Normal XY Antibody:Negative (01/27 1030)  Anatomic [place]  Rubella: 1.92 (01/27 1030)  Varicella: @VZVIGG @  GTT Early: hgb a1c 4.9              Third trimester:  RPR: Non Reactive (01/27 1030)   Rhogam  HBsAg: Negative (01/27 1030)   Vaccines TDAP:                       Flu Shot: Covid: HIV: Non Reactive (01/27 1030)   Baby Food                                GBS:   GC/CT:  Contraception  Pap: 03/08/2020 NILM  CBB     CS/VBAC    Support Person           Previous Version       Gestational age appropriate obstetric precautions including but not limited to vaginal bleeding, contractions, leaking of fluid and fetal movement were reviewed in detail with the patient.    Return in about 4 weeks (around 09/07/2020) for 4-5 week ROB and anatomy scan.  09/09/2020, MD, [facility], [facility] 08/10/2020, 12:27 PM

## 2020-08-22 ENCOUNTER — Telehealth: Payer: Self-pay

## 2020-08-22 NOTE — Telephone Encounter (Signed)
Pt calling; rx'd from [facility]; c/o chest pain that started about an hour and a half ago; took tums in case it was indigestion/heartburn; not getting better; is getting worse; really hurts to bend over.  Adv to go to ED; allowed one person with her.

## 2020-09-06 ENCOUNTER — Ambulatory Visit (INDEPENDENT_AMBULATORY_CARE_PROVIDER_SITE_OTHER): Payer: Managed Care, Other (non HMO) | Admitting: Obstetrics and Gynecology

## 2020-09-06 ENCOUNTER — Other Ambulatory Visit: Payer: Self-pay

## 2020-09-06 ENCOUNTER — Ambulatory Visit (INDEPENDENT_AMBULATORY_CARE_PROVIDER_SITE_OTHER): Payer: Managed Care, Other (non HMO)

## 2020-09-06 ENCOUNTER — Encounter: Payer: Self-pay | Admitting: Obstetrics and Gynecology

## 2020-09-06 DIAGNOSIS — Z363 Encounter for antenatal screening for malformations: Secondary | ICD-10-CM | POA: Diagnosis not present

## 2020-09-06 DIAGNOSIS — Z3A18 18 weeks gestation of pregnancy: Secondary | ICD-10-CM | POA: Diagnosis not present

## 2020-09-06 DIAGNOSIS — O0990 Supervision of high risk pregnancy, unspecified, unspecified trimester: Secondary | ICD-10-CM | POA: Diagnosis not present

## 2020-09-06 DIAGNOSIS — O99210 Obesity complicating pregnancy, unspecified trimester: Secondary | ICD-10-CM

## 2020-09-06 NOTE — Progress Notes (Signed)
    Routine Prenatal Care Visit  Subjective  Kendra Hunter is a 30 y.o. G3P2002 at [redacted]w[redacted]d being seen today for ongoing prenatal care.  She is currently monitored for the following issues for this high-risk pregnancy and has Acute blood loss anemia; History of successful vaginal birth after cesarean, currently pregnant; Maternal obesity, antepartum; History of gestational hypertension; and Supervision of high risk pregnancy, antepartum on their problem list.  ----------------------------------------------------------------------------------- Patient reports no complaints.   Contractions: Not present. Vag. Bleeding: None.  Movement: Absent. Denies leaking of fluid.  ----------------------------------------------------------------------------------- The following portions of the patient's history were reviewed and updated as appropriate: allergies, current medications, past family history, past medical history, past social history, past surgical history and problem list. Problem list updated.   Objective  Last menstrual period 04/19/2020. Pregravid weight 217 lb (98.4 kg) Total Weight Gain 20 lb 9.6 oz (9.344 kg) Urinalysis:      Fetal Status: Fetal Heart Rate (bpm): 150   Movement: Absent     General:  Alert, oriented and cooperative. Patient is in no acute distress.  Skin: Skin is warm and dry. No rash noted.   Cardiovascular: Normal heart rate noted  Respiratory: Normal respiratory effort, no problems with respiration noted  Abdomen: Soft, gravid, appropriate for gestational age. Pain/Pressure: Absent     Pelvic:  Cervical exam deferred        Extremities: Normal range of motion.     Mental Status: Normal mood and affect. Normal behavior. Normal judgment and thought content.     Assessment   30 y.o. G3P2002 at [redacted]w[redacted]d by  02/01/2021, by Ultrasound presenting for routine prenatal visit  Plan   pregnancy 4 Problems (from 06/15/20 to present)    Problem Noted Resolved    Supervision of high risk pregnancy, antepartum 06/15/2020 by Vena Austria, MD No   Overview Addendum 09/06/2020 12:13 PM by Natale Milch, MD     Nursing Staff Provider  Office Location  [facility] Dating   7 wk [place]  Language  English Anatomy US  complete  Flu Vaccine  2021 Genetic Screen  NIPS: normal XY  TDaP vaccine    Hgb A1C or  GTT Early hgba1c: 4.9 Third trimester :   Covid  vaccinated   LAB RESULTS   Rhogam   not needed Blood Type O/Positive/-- (01/27 1030)   Feeding Plan  Breast Antibody Negative (01/27 1030)  Contraception  Mirena IUD Rubella 1.92 (01/27 1030)  Circumcision  RPR Non Reactive (01/27 1030)   Pediatrician   HBsAg Negative (01/27 1030)   Support Person  HIV Non Reactive (01/27 1030)  Prenatal Classes  Varicella  Immune    GBS  (For PCN allergy, check sensitivities)   BTL Consent     VBAC Consent [ ]  desires Pap   NIL 2021    Hgb Electro      CF      SMA         History of Cesarean Section- desires VBAC      Previous Version     Anatomy Scan WNL  Gestational age appropriate obstetric precautions including but not limited to vaginal bleeding, contractions, leaking of fluid and fetal movement were reviewed in detail with the patient.    Return in about 4 weeks (around 10/04/2020) for ROB in person.  10/06/2020 MD [facility], [facility] 09/06/2020, 12:13 PM

## 2020-09-06 NOTE — Patient Instructions (Signed)

## 2020-09-18 ENCOUNTER — Telehealth: Payer: Self-pay

## 2020-09-18 NOTE — Telephone Encounter (Signed)
Pt calling; felt bad Friday; Saturday had vomiting; Sunday had diarrhea every time she would eat or drink; today has n/v/d.  [phone]  Adv to let system rest; sip clear liquids 24hrs; the bland food for 24hrs; then work up to reg diet as tolerated; pt aware to be seen if she doesn't keep anything down for 24hrs; daughter is sick as well; pt thinks they have a stomach bug.  Pt hasn't noticed any fever.

## 2020-10-04 ENCOUNTER — Ambulatory Visit (INDEPENDENT_AMBULATORY_CARE_PROVIDER_SITE_OTHER): Payer: Managed Care, Other (non HMO) | Admitting: Obstetrics and Gynecology

## 2020-10-04 ENCOUNTER — Encounter: Payer: Self-pay | Admitting: Obstetrics and Gynecology

## 2020-10-04 ENCOUNTER — Other Ambulatory Visit: Payer: Self-pay

## 2020-10-04 VITALS — BP 134/87 | Wt 239.0 lb

## 2020-10-04 DIAGNOSIS — O0992 Supervision of high risk pregnancy, unspecified, second trimester: Secondary | ICD-10-CM

## 2020-10-04 DIAGNOSIS — Z113 Encounter for screening for infections with a predominantly sexual mode of transmission: Secondary | ICD-10-CM

## 2020-10-04 DIAGNOSIS — Z3A22 22 weeks gestation of pregnancy: Secondary | ICD-10-CM

## 2020-10-04 DIAGNOSIS — O34219 Maternal care for unspecified type scar from previous cesarean delivery: Secondary | ICD-10-CM

## 2020-10-04 DIAGNOSIS — O99210 Obesity complicating pregnancy, unspecified trimester: Secondary | ICD-10-CM

## 2020-10-04 DIAGNOSIS — Z8759 Personal history of other complications of pregnancy, childbirth and the puerperium: Secondary | ICD-10-CM

## 2020-10-04 DIAGNOSIS — Z131 Encounter for screening for diabetes mellitus: Secondary | ICD-10-CM

## 2020-10-04 LAB — POCT URINALYSIS DIPSTICK OB
Glucose, UA: NEGATIVE
POC,PROTEIN,UA: NEGATIVE

## 2020-10-04 NOTE — Progress Notes (Signed)
Routine Prenatal Care Visit  Subjective  Kendra Hunter is a 30 y.o. G3P2002 at [redacted]w[redacted]d being seen today for ongoing prenatal care.  She is currently monitored for the following issues for this high-risk pregnancy and has Acute blood loss anemia; History of successful vaginal birth after cesarean, currently pregnant; Maternal obesity, antepartum; History of gestational hypertension; and Supervision of high risk pregnancy, antepartum on their problem list.  ----------------------------------------------------------------------------------- Patient reports no complaints.  She notes a bump on her right labium majus Contractions: Not present. Vag. Bleeding: None.  Movement: Present. Leaking Fluid denies.  ----------------------------------------------------------------------------------- The following portions of the patient's history were reviewed and updated as appropriate: allergies, current medications, past family history, past medical history, past social history, past surgical history and problem list. Problem list updated.  Objective  Blood pressure 134/87, weight 239 lb (108.4 kg), last menstrual period 04/19/2020. Pregravid weight 217 lb (98.4 kg) Total Weight Gain 22 lb (9.979 kg) Urinalysis: Urine Protein Negative  Urine Glucose Negative  Fetal Status: Fetal Heart Rate (bpm): 135   Movement: Present     General:  Alert, oriented and cooperative. Patient is in no acute distress.  Skin: Skin is warm and dry. No rash noted.   Cardiovascular: Normal heart rate noted  Respiratory: Normal respiratory effort, no problems with respiration noted  Abdomen: Soft, gravid, appropriate for gestational age. Pain/Pressure: Present     Pelvic:  Cervical exam deferred       external pelvic performed, Approximated 1.5 cm raised area that is soft, same color as surrounding skin.  The only difference with this lesion is that it is raised. No induration, no erythema.  Extremities: Normal range of  motion.     Mental Status: Normal mood and affect. Normal behavior. Normal judgment and thought content.   Female chaperone present for pelvic exam:   Assessment   30 y.o. G3P2002 at [redacted]w[redacted]d by  02/01/2021, by Ultrasound presenting for routine prenatal visit  Plan   pregnancy 4 Problems (from 06/15/20 to present)    Problem Noted Resolved   Maternal obesity, antepartum 06/15/2020 by Vena Austria, MD No   History of gestational hypertension 06/15/2020 by Vena Austria, MD No   Supervision of high risk pregnancy, antepartum 06/15/2020 by Vena Austria, MD No   Overview Addendum 09/06/2020 12:14 PM by Natale Milch, MD     Nursing Staff Provider  Office Location  [facility] Dating   7 wk [place]  Language  English Anatomy US  complete  Flu Vaccine  2021 Genetic Screen  NIPS: normal XY  TDaP vaccine    Hgb A1C or  GTT Early hgba1c: 4.9 Third trimester :   Covid  vaccinated   LAB RESULTS   Rhogam   not needed Blood Type O/Positive/-- (01/27 1030)   Feeding Plan  Breast Antibody Negative (01/27 1030)  Contraception  Mirena IUD Rubella 1.92 (01/27 1030)  Circumcision  RPR Non Reactive (01/27 1030)   Pediatrician   HBsAg Negative (01/27 1030)   Support Person  HIV Non Reactive (01/27 1030)  Prenatal Classes  Varicella  Immune    GBS  (For PCN allergy, check sensitivities)   BTL Consent     VBAC Consent [ ]  desires Pap   NIL 2021    Hgb Electro      CF      SMA         History of Cesarean Section- desires VBAC, hx of prior cesarean then successful VBAC      Previous  Version   History of successful vaginal birth after cesarean, currently pregnant 02/19/2016 by Farrel Conners, CNM No       Preterm labor symptoms and general obstetric precautions including but not limited to vaginal bleeding, contractions, leaking of fluid and fetal movement were reviewed in detail with the patient. Please refer to After Visit Summary for other counseling recommendations.   - monitor  labial area. Differential is wide as it is very non-specific at this point - 28 week labs next  Visit - monitor BPs with history of gHTN  Return in about 4 weeks (around 11/01/2020) for 28 week labs with ROB.   Thomasene Mohair, MD, [facility], [facility] 10/04/2020 10:52 AM

## 2020-11-01 ENCOUNTER — Ambulatory Visit (INDEPENDENT_AMBULATORY_CARE_PROVIDER_SITE_OTHER): Payer: Managed Care, Other (non HMO) | Admitting: Obstetrics and Gynecology

## 2020-11-01 ENCOUNTER — Other Ambulatory Visit: Payer: Self-pay

## 2020-11-01 VITALS — BP 115/81 | Wt 242.0 lb

## 2020-11-01 DIAGNOSIS — Z8759 Personal history of other complications of pregnancy, childbirth and the puerperium: Secondary | ICD-10-CM

## 2020-11-01 DIAGNOSIS — O99210 Obesity complicating pregnancy, unspecified trimester: Secondary | ICD-10-CM

## 2020-11-01 DIAGNOSIS — O34219 Maternal care for unspecified type scar from previous cesarean delivery: Secondary | ICD-10-CM

## 2020-11-01 DIAGNOSIS — Z369 Encounter for antenatal screening, unspecified: Secondary | ICD-10-CM

## 2020-11-01 DIAGNOSIS — O0990 Supervision of high risk pregnancy, unspecified, unspecified trimester: Secondary | ICD-10-CM

## 2020-11-01 NOTE — Progress Notes (Signed)
ROB - labs, no concerns. RM 2

## 2020-11-01 NOTE — Progress Notes (Signed)
Routine Prenatal Care Visit  Subjective  Kendra Hunter is a 30 y.o. G3P2002 at [redacted]w[redacted]d being seen today for ongoing prenatal care.  She is currently monitored for the following issues for this high-risk pregnancy and has Acute blood loss anemia; History of successful vaginal birth after cesarean, currently pregnant; Maternal obesity, antepartum; History of gestational hypertension; and Supervision of high risk pregnancy, antepartum on their problem list.  ----------------------------------------------------------------------------------- Patient reports no complaints.  Worsening back pain at work Contractions: Not present. Vag. Bleeding: None.  Movement: Present. Denies leaking of fluid.  ----------------------------------------------------------------------------------- The following portions of the patient's history were reviewed and updated as appropriate: allergies, current medications, past family history, past medical history, past social history, past surgical history and problem list. Problem list updated.   Objective  Blood pressure 115/81, weight 242 lb (109.8 kg), last menstrual period 04/19/2020. Pregravid weight 217 lb (98.4 kg) Total Weight Gain 25 lb (11.3 kg) Body mass index is 40.27 kg/m.  Urinalysis:      Fetal Status: Fetal Heart Rate (bpm): 155   Movement: Present     General:  Alert, oriented and cooperative. Patient is in no acute distress.  Skin: Skin is warm and dry. No rash noted.   Cardiovascular: Normal heart rate noted  Respiratory: Normal respiratory effort, no problems with respiration noted  Abdomen: Soft, gravid, appropriate for gestational age. Pain/Pressure: Present     Pelvic:  Cervical exam deferred        Extremities: Normal range of motion.     ental Status: Normal mood and affect. Normal behavior. Normal judgment and thought content.     Assessment   30 y.o. G3P2002 at [redacted]w[redacted]d by  02/01/2021, by Ultrasound presenting for routine prenatal  visit  Plan   pregnancy 4 Problems (from 06/15/20 to present)     Problem Noted Resolved   Maternal obesity, antepartum 06/15/2020 by Vena Austria, MD No   History of gestational hypertension 06/15/2020 by Vena Austria, MD No   Supervision of high risk pregnancy, antepartum 06/15/2020 by Vena Austria, MD No   Overview Addendum 09/06/2020 12:14 PM by Natale Milch, MD     Nursing Staff Provider  Office Location  [facility] Dating   7 wk [place]  Language  English Anatomy US  complete  Flu Vaccine  2021 Genetic Screen  NIPS: normal XY  TDaP vaccine    Hgb A1C or  GTT Early hgba1c: 4.9 Third trimester :   Covid  vaccinated   LAB RESULTS   Rhogam   not needed Blood Type O/Positive/-- (01/27 1030)   Feeding Plan  Breast Antibody Negative (01/27 1030)  Contraception  Mirena IUD Rubella 1.92 (01/27 1030)  Circumcision  RPR Non Reactive (01/27 1030)   Pediatrician   HBsAg Negative (01/27 1030)   Support Person  HIV Non Reactive (01/27 1030)  Prenatal Classes  Varicella  Immune    GBS  (For PCN allergy, check sensitivities)   BTL Consent     VBAC Consent [ ]  desires Pap   NIL 2021    Hgb Electro      CF      SMA        History of Cesarean Section- desires VBAC, hx of prior cesarean then successful VBAC       History of successful vaginal birth after cesarean, currently pregnant 02/19/2016 by 04/20/2016, CNM No        Gestational age appropriate obstetric precautions including but not limited to vaginal bleeding,  contractions, leaking of fluid and fetal movement were reviewed in detail with the patient.    1) Lumbago - discussed supportive measures (localized heat, topical lineament creams, and pregnancy support belt)  Return in about 2 weeks (around 11/15/2020) for ROB and 28 week labs.  Vena Austria, MD, [facility], [facility] 11/01/2020, 9:30 AM

## 2020-11-15 ENCOUNTER — Ambulatory Visit (INDEPENDENT_AMBULATORY_CARE_PROVIDER_SITE_OTHER): Payer: Managed Care, Other (non HMO) | Admitting: Obstetrics and Gynecology

## 2020-11-15 ENCOUNTER — Other Ambulatory Visit: Payer: Managed Care, Other (non HMO)

## 2020-11-15 ENCOUNTER — Encounter: Payer: Self-pay | Admitting: Obstetrics and Gynecology

## 2020-11-15 ENCOUNTER — Other Ambulatory Visit: Payer: Self-pay

## 2020-11-15 VITALS — BP 122/82 | Wt 245.0 lb

## 2020-11-15 DIAGNOSIS — Z131 Encounter for screening for diabetes mellitus: Secondary | ICD-10-CM

## 2020-11-15 DIAGNOSIS — O0993 Supervision of high risk pregnancy, unspecified, third trimester: Secondary | ICD-10-CM

## 2020-11-15 DIAGNOSIS — Z8759 Personal history of other complications of pregnancy, childbirth and the puerperium: Secondary | ICD-10-CM

## 2020-11-15 DIAGNOSIS — Z113 Encounter for screening for infections with a predominantly sexual mode of transmission: Secondary | ICD-10-CM

## 2020-11-15 DIAGNOSIS — Z3A28 28 weeks gestation of pregnancy: Secondary | ICD-10-CM

## 2020-11-15 DIAGNOSIS — O0992 Supervision of high risk pregnancy, unspecified, second trimester: Secondary | ICD-10-CM

## 2020-11-15 DIAGNOSIS — O34219 Maternal care for unspecified type scar from previous cesarean delivery: Secondary | ICD-10-CM

## 2020-11-15 DIAGNOSIS — O99210 Obesity complicating pregnancy, unspecified trimester: Secondary | ICD-10-CM

## 2020-11-15 LAB — POCT URINALYSIS DIPSTICK OB
Glucose, UA: NEGATIVE
POC,PROTEIN,UA: NEGATIVE

## 2020-11-15 NOTE — Progress Notes (Signed)
Routine Prenatal Care Visit  Subjective  Kendra Hunter is a 30 y.o. G3P2002 at [redacted]w[redacted]d being seen today for ongoing prenatal care.  She is currently monitored for the following issues for this low-risk pregnancy and has Acute blood loss anemia; History of successful vaginal birth after cesarean, currently pregnant; Maternal obesity, antepartum; History of gestational hypertension; Supervision of high risk pregnancy, antepartum; and History of cesarean delivery affecting pregnancy on their problem list.  ----------------------------------------------------------------------------------- Patient reports no complaints.   Contractions: Not present. Vag. Bleeding: None.  Movement: Present. Leaking Fluid denies.  ----------------------------------------------------------------------------------- The following portions of the patient's history were reviewed and updated as appropriate: allergies, current medications, past family history, past medical history, past social history, past surgical history and problem list. Problem list updated.  Objective  Blood pressure 122/82, weight 245 lb (111.1 kg), last menstrual period 04/19/2020. Pregravid weight 217 lb (98.4 kg) Total Weight Gain 28 lb (12.7 kg) Urinalysis: Urine Protein Negative  Urine Glucose Negative  Fetal Status: Fetal Heart Rate (bpm): 135 Fundal Height: 28 cm Movement: Present     General:  Alert, oriented and cooperative. Patient is in no acute distress.  Skin: Skin is warm and dry. No rash noted.   Cardiovascular: Normal heart rate noted  Respiratory: Normal respiratory effort, no problems with respiration noted  Abdomen: Soft, gravid, appropriate for gestational age. Pain/Pressure: Absent     Pelvic:  Cervical exam deferred        Extremities: Normal range of motion.     Mental Status: Normal mood and affect. Normal behavior. Normal judgment and thought content.   Assessment   30 y.o. G3P2002 at 110w6d by  02/01/2021, by  Ultrasound presenting for routine prenatal visit  Plan   pregnancy 4 Problems (from 06/15/20 to present)     Problem Noted Resolved   History of cesarean delivery affecting pregnancy 11/15/2020 by Conard Novak, MD No   Maternal obesity, antepartum 06/15/2020 by Vena Austria, MD No   History of gestational hypertension 06/15/2020 by Vena Austria, MD No   Supervision of high risk pregnancy, antepartum 06/15/2020 by Vena Austria, MD No   Overview Addendum 09/06/2020 12:14 PM by Natale Milch, MD     Nursing Staff Provider  Office Location  [facility] Dating   7 wk [place]  Language  English Anatomy US  complete  Flu Vaccine  2021 Genetic Screen  NIPS: normal XY  TDaP vaccine    Hgb A1C or  GTT Early hgba1c: 4.9 Third trimester :   Covid  vaccinated   LAB RESULTS   Rhogam   not needed Blood Type O/Positive/-- (01/27 1030)   Feeding Plan  Breast Antibody Negative (01/27 1030)  Contraception  Mirena IUD Rubella 1.92 (01/27 1030)  Circumcision  RPR Non Reactive (01/27 1030)   Pediatrician   HBsAg Negative (01/27 1030)   Support Person  HIV Non Reactive (01/27 1030)  Prenatal Classes  Varicella  Immune    GBS  (For PCN allergy, check sensitivities)   BTL Consent     VBAC Consent [ ]  desires Pap   NIL 2021    Hgb Electro      CF      SMA        History of Cesarean Section- desires VBAC, hx of prior cesarean then successful VBAC       History of successful vaginal birth after cesarean, currently pregnant 02/19/2016 by 04/20/2016, CNM No        Preterm labor symptoms  and general obstetric precautions including but not limited to vaginal bleeding, contractions, leaking of fluid and fetal movement were reviewed in detail with the patient. Please refer to After Visit Summary for other counseling recommendations.   - 28 wk labs today -plans breast feeding.  Return in about 2 weeks (around 11/29/2020) for Routine Prenatal Appointment.   Thomasene Mohair, MD,  [facility], [facility] 11/15/2020 2:25 PM

## 2020-11-16 ENCOUNTER — Encounter: Payer: Managed Care, Other (non HMO) | Admitting: Obstetrics and Gynecology

## 2020-11-16 LAB — 28 WEEK RH+PANEL
Basophils Absolute: 0 10*3/uL (ref 0.0–0.2)
Basos: 0 %
EOS (ABSOLUTE): 0.1 10*3/uL (ref 0.0–0.4)
Eos: 1 %
Gestational Diabetes Screen: 75 mg/dL (ref 65–139)
HIV Screen 4th Generation wRfx: NONREACTIVE
Hematocrit: 39.6 % (ref 34.0–46.6)
Hemoglobin: 13.6 g/dL (ref 11.1–15.9)
Immature Grans (Abs): 0 10*3/uL (ref 0.0–0.1)
Immature Granulocytes: 0 %
Lymphocytes Absolute: 2.3 10*3/uL (ref 0.7–3.1)
Lymphs: 24 %
MCH: 30.8 pg (ref 26.6–33.0)
MCHC: 34.3 g/dL (ref 31.5–35.7)
MCV: 90 fL (ref 79–97)
Monocytes Absolute: 0.7 10*3/uL (ref 0.1–0.9)
Monocytes: 7 %
Neutrophils Absolute: 6.7 10*3/uL (ref 1.4–7.0)
Neutrophils: 68 %
Platelets: 240 10*3/uL (ref 150–450)
RBC: 4.41 x10E6/uL (ref 3.77–5.28)
RDW: 12 % (ref 11.7–15.4)
RPR Ser Ql: NONREACTIVE
WBC: 9.9 10*3/uL (ref 3.4–10.8)

## 2020-11-17 ENCOUNTER — Other Ambulatory Visit: Payer: Self-pay

## 2020-11-17 ENCOUNTER — Encounter: Payer: Self-pay | Admitting: Obstetrics and Gynecology

## 2020-11-17 ENCOUNTER — Telehealth: Payer: Self-pay

## 2020-11-17 ENCOUNTER — Observation Stay
Admission: EM | Admit: 2020-11-17 | Discharge: 2020-11-17 | Disposition: A | Discharge status: Home / Self Care | Payer: Managed Care, Other (non HMO) | Attending: Advanced Practice Midwife | Admitting: Advanced Practice Midwife

## 2020-11-17 DIAGNOSIS — O0993 Supervision of high risk pregnancy, unspecified, third trimester: Secondary | ICD-10-CM | POA: Insufficient documentation

## 2020-11-17 DIAGNOSIS — Z3A29 29 weeks gestation of pregnancy: Secondary | ICD-10-CM | POA: Insufficient documentation

## 2020-11-17 DIAGNOSIS — O99891 Other specified diseases and conditions complicating pregnancy: Secondary | ICD-10-CM | POA: Diagnosis not present

## 2020-11-17 DIAGNOSIS — O99213 Obesity complicating pregnancy, third trimester: Secondary | ICD-10-CM | POA: Diagnosis not present

## 2020-11-17 DIAGNOSIS — Z8759 Personal history of other complications of pregnancy, childbirth and the puerperium: Secondary | ICD-10-CM

## 2020-11-17 DIAGNOSIS — E669 Obesity, unspecified: Secondary | ICD-10-CM | POA: Insufficient documentation

## 2020-11-17 DIAGNOSIS — O26893 Other specified pregnancy related conditions, third trimester: Secondary | ICD-10-CM | POA: Diagnosis not present

## 2020-11-17 DIAGNOSIS — R102 Pelvic and perineal pain: Secondary | ICD-10-CM | POA: Diagnosis not present

## 2020-11-17 DIAGNOSIS — N898 Other specified noninflammatory disorders of vagina: Secondary | ICD-10-CM | POA: Insufficient documentation

## 2020-11-17 DIAGNOSIS — W19XXXA Unspecified fall, initial encounter: Secondary | ICD-10-CM | POA: Insufficient documentation

## 2020-11-17 DIAGNOSIS — O0990 Supervision of high risk pregnancy, unspecified, unspecified trimester: Secondary | ICD-10-CM

## 2020-11-17 DIAGNOSIS — O34219 Maternal care for unspecified type scar from previous cesarean delivery: Secondary | ICD-10-CM

## 2020-11-17 DIAGNOSIS — O99210 Obesity complicating pregnancy, unspecified trimester: Secondary | ICD-10-CM

## 2020-11-17 NOTE — OB Triage Note (Signed)
Patient Discharged home per provider. Pt educated about labor precautions and informed when to return to the ED for further evaluation. Pt instructed to keep all follow up appointments with her provider. AVS given to patient and RN answered all questions and patient has no further questions at this time. Pt discharged home in stable condition with significant other.   

## 2020-11-17 NOTE — Progress Notes (Signed)
CNM notified of patient arrival to unit. Orders given and will continue to monitor.

## 2020-11-17 NOTE — Telephone Encounter (Signed)
Pt calling; 29wks; fell in parking lot yesterday; lost mucus plug this am; having pain and pressure.  [phone]  Adv okay to lose mucus plug as it doesn't mean going into labor any time soon; explained what true ctxs are and how to count them since 29wk; aware if has more than four ctxs an hour to go to L&D; pt states she landed hard on knees and hands; doesn't think abd touched ground; reports GFM, not vaginal bleedng; does have a lot of pain and pressure down below to the point it is not comfortable to walk.  Will ck c providers and get back to her.

## 2020-11-17 NOTE — Telephone Encounter (Signed)
Per MMF to L&D for limited monitoring.  Pt aware by detailed msg. Jessica notified in L&D.

## 2020-11-17 NOTE — Discharge Summary (Signed)
Physician Final Progress Note  Patient ID: ALEXEE DELSANTO MRN: [id number] DOB/AGE: 08-27-1990 30 y.o.  Admit date: 11/17/2020 Admitting provider: Tresea Mall, CNM Discharge date: 11/17/2020   Admission Diagnoses: fall yesterday, mucous discharge, pelvic pressure  Discharge Diagnoses:  Principal Problem:   Supervision of high risk pregnancy, antepartum Active Problems:   Maternal obesity, antepartum   Labor and delivery, indication for care   [redacted] weeks gestation of pregnancy    History of Present Illness: The patient is a 30 y.o. female G3P2002 at 100w1d who presents for feeling stronger pelvic pressure after a fall yesterday. She landed on hands and knees and denies hitting her abdomen. She denies vaginal bleeding, leakage of fluid or contractions. She reports a small amount of clear mucous when wiping in the middle of the night. She reports good fetal movement. Comfort measures for round ligament pain and pelvic pressure.  Past Medical History:  Diagnosis Date   Acute blood loss anemia 03/31/2013   Hypertension in pregnancy 03/29/2013   Postpartum care following cesarean delivery 03/31/2013   S/P primary low transverse C-section 03/31/2013    Past Surgical History:  Procedure Laterality Date   CESAREAN SECTION N/A 03/30/2013   Procedure: Primary Cesarean Section Delivery Baby boy @ 2321, Apgars 9/9;  Surgeon: Robley Fries, MD;  Location: [facility];  Service: Obstetrics;  Laterality: N/A;   CHOLECYSTECTOMY     WISDOM TOOTH EXTRACTION      No current facility-administered medications on file prior to encounter.   Current Outpatient Medications on File Prior to Encounter  Medication Sig Dispense Refill   magnesium oxide (MAG-OX) 400 MG tablet Take 400 mg by mouth daily.     omeprazole (PRILOSEC) 20 MG capsule Take 1 capsule (20 mg total) by mouth daily. 90 capsule 3   Prenatal Vit-Fe Fumarate-FA (MULTIVITAMIN-PRENATAL) 27-0.8 MG TABS tablet Take 1 tablet by mouth daily at 12  noon.      No Known Allergies  Social History   Socioeconomic History   Marital status: Married    Spouse name: De Nurse   Number of children: Not on file   Years of education: Not on file   Highest education level: Not on file  Occupational History   Not on file  Tobacco Use   Smoking status: Never   Smokeless tobacco: Never  Vaping Use   Vaping Use: Never used  Substance and Sexual Activity   Alcohol use: No   Drug use: No   Sexual activity: Yes  Other Topics Concern   Not on file  Social History Narrative   Not on file   Social Determinants of Health   Financial Resource Strain: Not on file  Food Insecurity: Not on file  Transportation Needs: Not on file  Physical Activity: Not on file  Stress: Not on file  Social Connections: Not on file  Intimate Partner Violence: Not on file    History reviewed. No pertinent family history.   Review of Systems  Constitutional:  Negative for chills and fever.  HENT:  Negative for congestion, ear discharge, ear pain, hearing loss, sinus pain and sore throat.   Eyes:  Negative for blurred vision and double vision.  Respiratory:  Negative for cough, shortness of breath and wheezing.   Cardiovascular:  Negative for chest pain, palpitations and leg swelling.  Gastrointestinal:  Negative for abdominal pain, blood in stool, constipation, diarrhea, heartburn, melena, nausea and vomiting.  Genitourinary:  Negative for dysuria, flank pain, frequency, hematuria and urgency.  Positive vaginal discharge, pelvic pressure  Musculoskeletal:  Negative for back pain, joint pain and myalgias.  Skin:  Negative for itching and rash.  Neurological:  Negative for dizziness, tingling, tremors, sensory change, speech change, focal weakness, seizures, loss of consciousness, weakness and headaches.  Endo/Heme/Allergies:  Negative for environmental allergies. Does not bruise/bleed easily.  Psychiatric/Behavioral:  Negative for depression,  hallucinations, memory loss, substance abuse and suicidal ideas. The patient is not nervous/anxious and does not have insomnia.     Physical Exam: BP 110/70   Pulse 83   Temp 98 F (36.7 C) (Oral)   Resp 16   Ht 5\' 5"  (1.651 m)   Wt 111.1 kg   LMP 04/19/2020   BMI 40.77 kg/m   Constitutional: Well nourished, well developed female in no acute distress.  HEENT: normal Skin: Warm and dry.  Cardiovascular: Regular rate and rhythm.   Extremity:  no edema   Respiratory: Clear to auscultation bilateral. Normal respiratory effort Abdomen: FHT present Back: no CVAT Neuro: DTRs 2+, Cranial nerves grossly intact Psych: Alert and Oriented x3. No memory deficits. Normal mood and affect.  MS: normal gait, normal bilateral lower extremity ROM/strength/stability.  Pelvic exam: deferred Toco: negative for contractions Fetal well being: 130 bpm, moderate variability, +accelerations, -decelerations  Consults: None  Significant Findings/ Diagnostic Studies: none  Procedures: NST  Hospital Course: The patient was admitted to Labor and Delivery Triage for observation.   Discharge Condition: good  Disposition: Discharge disposition: 01-Home or Self Care  Diet: Regular diet  Discharge Activity: Activity as tolerated  Discharge Instructions     Discharge activity:  No Restrictions   Complete by: As directed    Discharge diet:  No restrictions   Complete by: As directed    No sexual activity restrictions   Complete by: As directed    Notify physician for a general feeling that "something is not right"   Complete by: As directed    Notify physician for increase or change in vaginal discharge   Complete by: As directed    Notify physician for intestinal cramps, with or without diarrhea, sometimes described as "gas pain"   Complete by: As directed    Notify physician for leaking of fluid   Complete by: As directed    Notify physician for low, dull backache, unrelieved by heat or  Tylenol   Complete by: As directed    Notify physician for menstrual like cramps   Complete by: As directed    Notify physician for pelvic pressure   Complete by: As directed    Notify physician for uterine contractions.  These may be painless and feel like the uterus is tightening or the baby is  "balling up"   Complete by: As directed    Notify physician for vaginal bleeding   Complete by: As directed    PRETERM LABOR:  Includes any of the follwing symptoms that occur between 20 - [redacted] weeks gestation.  If these symptoms are not stopped, preterm labor can result in preterm delivery, placing your baby at risk   Complete by: As directed       Allergies as of 11/17/2020   No Known Allergies      Medication List     TAKE these medications    magnesium oxide 400 MG tablet Commonly known as: MAG-OX Take 400 mg by mouth daily.   multivitamin-prenatal 27-0.8 MG Tabs tablet Take 1 tablet by mouth daily at 12 noon.   omeprazole 20 MG capsule Commonly  known as: PRILOSEC Take 1 capsule (20 mg total) by mouth daily.        Follow-up Information     [facility] Go to.   Specialty: Obstetrics and Gynecology Why: scheduled prenatal appointment Contact information: [address] [place] [address] [phone]                Total time spent taking care of this patient: 15 minutes  Signed: Tresea Mall, CNM  11/17/2020, 2:24 PM

## 2020-11-17 NOTE — OB Triage Note (Addendum)
Pt is a G32002 at [redacted]w[redacted]d that presents from ED with complaints of pelvic pressure and losing her mucous plug after a fall. Pt had a C/S for her first pregnancy and a vaginal delivery for her second. Pt states she was chasing her daughter in a parking lot yesterday around 1600. Pt states she fell onto her knees and did not hit her stomach. Pt states she has had pelvic pressure ongoing this pregnancy but states it is worse since the fall. Pt says she lost her mucous plug around 0300 this morning. Pt denies VB, LOF and states positive FM. EFM applied and initial FHT 135. Initial BP 134/97 and cycling q15.

## 2020-11-30 ENCOUNTER — Encounter: Payer: Self-pay | Admitting: Obstetrics and Gynecology

## 2020-11-30 ENCOUNTER — Other Ambulatory Visit: Payer: Self-pay

## 2020-11-30 ENCOUNTER — Ambulatory Visit (INDEPENDENT_AMBULATORY_CARE_PROVIDER_SITE_OTHER): Payer: Managed Care, Other (non HMO) | Admitting: Obstetrics and Gynecology

## 2020-11-30 VITALS — BP 126/86

## 2020-11-30 DIAGNOSIS — Z3A31 31 weeks gestation of pregnancy: Secondary | ICD-10-CM

## 2020-11-30 DIAGNOSIS — O99210 Obesity complicating pregnancy, unspecified trimester: Secondary | ICD-10-CM

## 2020-11-30 DIAGNOSIS — O34219 Maternal care for unspecified type scar from previous cesarean delivery: Secondary | ICD-10-CM

## 2020-11-30 DIAGNOSIS — Z23 Encounter for immunization: Secondary | ICD-10-CM | POA: Diagnosis not present

## 2020-11-30 DIAGNOSIS — Z8759 Personal history of other complications of pregnancy, childbirth and the puerperium: Secondary | ICD-10-CM

## 2020-11-30 DIAGNOSIS — O0993 Supervision of high risk pregnancy, unspecified, third trimester: Secondary | ICD-10-CM

## 2020-11-30 LAB — POCT URINALYSIS DIPSTICK OB
Glucose, UA: NEGATIVE
POC,PROTEIN,UA: NEGATIVE

## 2020-11-30 NOTE — Progress Notes (Signed)
Routine Prenatal Care Visit  Subjective  Kendra Hunter is a 30 y.o. G3P2002 at [redacted]w[redacted]d being seen today for ongoing prenatal care.  She is currently monitored for the following issues for this high-risk pregnancy and has Acute blood loss anemia; History of successful vaginal birth after cesarean, currently pregnant; Maternal obesity, antepartum; History of gestational hypertension; Supervision of high risk pregnancy, antepartum; History of cesarean delivery affecting pregnancy; Labor and delivery, indication for care; and [redacted] weeks gestation of pregnancy on their problem list.  ----------------------------------------------------------------------------------- Patient reports no complaints.   Contractions: Not present. Vag. Bleeding: None.  Movement: Present. Leaking Fluid denies.  ----------------------------------------------------------------------------------- The following portions of the patient's history were reviewed and updated as appropriate: allergies, current medications, past family history, past medical history, past social history, past surgical history and problem list. Problem list updated.  Objective  Blood pressure 126/86, last menstrual period 04/19/2020. Pregravid weight 217 lb (98.4 kg) Total Weight Gain 28 lb (12.7 kg) Urinalysis: Urine Protein Negative  Urine Glucose Negative  Fetal Status: Fetal Heart Rate (bpm): 145 Fundal Height: 31 cm Movement: Present     General:  Alert, oriented and cooperative. Patient is in no acute distress.  Skin: Skin is warm and dry. No rash noted.   Cardiovascular: Normal heart rate noted  Respiratory: Normal respiratory effort, no problems with respiration noted  Abdomen: Soft, gravid, appropriate for gestational age. Pain/Pressure: Absent     Pelvic:  Cervical exam deferred        Extremities: Normal range of motion.     Mental Status: Normal mood and affect. Normal behavior. Normal judgment and thought content.   Assessment    30 y.o. G3P2002 at [redacted]w[redacted]d by  02/01/2021, by Ultrasound presenting for routine prenatal visit  Plan   pregnancy 4 Problems (from 06/15/20 to present)     Problem Noted Resolved   [redacted] weeks gestation of pregnancy 11/17/2020 by Tresea Mall, CNM No   History of cesarean delivery affecting pregnancy 11/15/2020 by Conard Novak, MD No   Maternal obesity, antepartum 06/15/2020 by Vena Austria, MD No   History of gestational hypertension 06/15/2020 by Vena Austria, MD No   Supervision of high risk pregnancy, antepartum 06/15/2020 by Vena Austria, MD No   Overview Addendum 09/06/2020 12:14 PM by Natale Milch, MD     Nursing Staff Provider  Office Location  [facility] Dating   7 wk [place]  Language  English Anatomy US  complete  Flu Vaccine  2021 Genetic Screen  NIPS: normal XY  TDaP vaccine    Hgb A1C or  GTT Early hgba1c: 4.9 Third trimester :   Covid  vaccinated   LAB RESULTS   Rhogam   not needed Blood Type O/Positive/-- (01/27 1030)   Feeding Plan  Breast Antibody Negative (01/27 1030)  Contraception  Mirena IUD Rubella 1.92 (01/27 1030)  Circumcision  RPR Non Reactive (01/27 1030)   Pediatrician   HBsAg Negative (01/27 1030)   Support Person  HIV Non Reactive (01/27 1030)  Prenatal Classes  Varicella  Immune    GBS  (For PCN allergy, check sensitivities)   BTL Consent     VBAC Consent [ ]  desires Pap   NIL 2021    Hgb Electro      CF      SMA        History of Cesarean Section- desires VBAC, hx of prior cesarean then successful VBAC      History of successful vaginal birth after cesarean,  currently pregnant 02/19/2016 by Farrel Conners, CNM No        Preterm labor symptoms and general obstetric precautions including but not limited to vaginal bleeding, contractions, leaking of fluid and fetal movement were reviewed in detail with the patient. Please refer to After Visit Summary for other counseling recommendations.   TDaP today  Return in about 2  weeks (around 12/14/2020) for Routine Prenatal Appointment.   Thomasene Mohair, MD, [facility], [facility] 11/30/2020 2:45 PM

## 2020-12-19 ENCOUNTER — Other Ambulatory Visit: Payer: Self-pay

## 2020-12-19 ENCOUNTER — Ambulatory Visit (INDEPENDENT_AMBULATORY_CARE_PROVIDER_SITE_OTHER): Payer: Managed Care, Other (non HMO) | Admitting: Obstetrics and Gynecology

## 2020-12-19 ENCOUNTER — Encounter: Payer: Self-pay | Admitting: Obstetrics and Gynecology

## 2020-12-19 VITALS — BP 124/80 | Wt 249.0 lb

## 2020-12-19 DIAGNOSIS — O99210 Obesity complicating pregnancy, unspecified trimester: Secondary | ICD-10-CM

## 2020-12-19 DIAGNOSIS — O0993 Supervision of high risk pregnancy, unspecified, third trimester: Secondary | ICD-10-CM

## 2020-12-19 DIAGNOSIS — Z8759 Personal history of other complications of pregnancy, childbirth and the puerperium: Secondary | ICD-10-CM

## 2020-12-19 DIAGNOSIS — Z3A33 33 weeks gestation of pregnancy: Secondary | ICD-10-CM

## 2020-12-19 DIAGNOSIS — O34219 Maternal care for unspecified type scar from previous cesarean delivery: Secondary | ICD-10-CM

## 2020-12-19 LAB — POCT URINALYSIS DIPSTICK OB
Glucose, UA: NEGATIVE
POC,PROTEIN,UA: NEGATIVE

## 2020-12-19 NOTE — Progress Notes (Signed)
Routine Prenatal Care Visit  Subjective  Kendra Hunter is a 30 y.o. G3P2002 at [redacted]w[redacted]d being seen today for ongoing prenatal care.  She is currently monitored for the following issues for this low-risk pregnancy and has Acute blood loss anemia; History of successful vaginal birth after cesarean, currently pregnant; Maternal obesity, antepartum; History of gestational hypertension; Supervision of high risk pregnancy, antepartum; History of cesarean delivery affecting pregnancy; and Labor and delivery, indication for care on their problem list.  ----------------------------------------------------------------------------------- Patient reports no complaints.   Contractions: Irritability. Vag. Bleeding: None.  Movement: Present. Leaking Fluid denies.  ----------------------------------------------------------------------------------- The following portions of the patient's history were reviewed and updated as appropriate: allergies, current medications, past family history, past medical history, past social history, past surgical history and problem list. Problem list updated.  Objective  Blood pressure 124/80, weight 249 lb (112.9 kg), last menstrual period 04/19/2020. Pregravid weight 217 lb (98.4 kg) Total Weight Gain 32 lb (14.5 kg) Urinalysis: Urine Protein Negative  Urine Glucose Negative  Fetal Status: Fetal Heart Rate (bpm): 145 Fundal Height: 34 cm Movement: Present     General:  Alert, oriented and cooperative. Patient is in no acute distress.  Skin: Skin is warm and dry. No rash noted.   Cardiovascular: Normal heart rate noted  Respiratory: Normal respiratory effort, no problems with respiration noted  Abdomen: Soft, gravid, appropriate for gestational age. Pain/Pressure: Present     Pelvic:  Cervical exam deferred        Extremities: Normal range of motion.     Mental Status: Normal mood and affect. Normal behavior. Normal judgment and thought content.   Assessment   31 y.o.  G3P2002 at [redacted]w[redacted]d by  02/01/2021, by Ultrasound presenting for routine prenatal visit  Plan   pregnancy 4 Problems (from 06/15/20 to present)     Problem Noted Resolved   History of cesarean delivery affecting pregnancy 11/15/2020 by Conard Novak, MD No   Maternal obesity, antepartum 06/15/2020 by Vena Austria, MD No   History of gestational hypertension 06/15/2020 by Vena Austria, MD No   Supervision of high risk pregnancy, antepartum 06/15/2020 by Vena Austria, MD No   Overview Addendum 09/06/2020 12:14 PM by Natale Milch, MD     Nursing Staff Provider  Office Location  [facility] Dating   7 wk [place]  Language  English Anatomy US  complete  Flu Vaccine  2021 Genetic Screen  NIPS: normal XY  TDaP vaccine    Hgb A1C or  GTT Early hgba1c: 4.9 Third trimester :   Covid  vaccinated   LAB RESULTS   Rhogam   not needed Blood Type O/Positive/-- (01/27 1030)   Feeding Plan  Breast Antibody Negative (01/27 1030)  Contraception  Mirena IUD Rubella 1.92 (01/27 1030)  Circumcision  RPR Non Reactive (01/27 1030)   Pediatrician   HBsAg Negative (01/27 1030)   Support Person  HIV Non Reactive (01/27 1030)  Prenatal Classes  Varicella  Immune    GBS  (For PCN allergy, check sensitivities)   BTL Consent     VBAC Consent [ ]  desires Pap   NIL 2021    Hgb Electro      CF      SMA        History of Cesarean Section- desires VBAC, hx of prior cesarean then successful VBAC      History of successful vaginal birth after cesarean, currently pregnant 02/19/2016 by 04/20/2016, CNM No   [redacted] weeks gestation of  pregnancy 11/17/2020 by Tresea Mall, CNM 12/19/2020 by Conard Novak, MD        Preterm labor symptoms and general obstetric precautions including but not limited to vaginal bleeding, contractions, leaking of fluid and fetal movement were reviewed in detail with the patient. Please refer to After Visit Summary for other counseling recommendations.   - attempted  to schedule a growth u/s given BMI.   - patient had covid 2 weeks ago. She has no residual sx.   Return in about 2 weeks (around 01/02/2021) for Routine Prenatal Appointment.   Thomasene Mohair, MD, [facility], [facility] 12/19/2020 2:22 PM

## 2020-12-22 ENCOUNTER — Ambulatory Visit
Admission: RE | Admit: 2020-12-22 | Discharge: 2020-12-22 | Disposition: A | Discharge status: Home / Self Care | Payer: Managed Care, Other (non HMO) | Source: Ambulatory Visit | Attending: Obstetrics and Gynecology | Admitting: Obstetrics and Gynecology

## 2020-12-22 ENCOUNTER — Other Ambulatory Visit: Payer: Self-pay

## 2020-12-22 DIAGNOSIS — Z3A33 33 weeks gestation of pregnancy: Secondary | ICD-10-CM

## 2020-12-22 DIAGNOSIS — O0993 Supervision of high risk pregnancy, unspecified, third trimester: Secondary | ICD-10-CM

## 2020-12-26 ENCOUNTER — Encounter: Payer: Self-pay | Admitting: Obstetrics and Gynecology

## 2020-12-26 ENCOUNTER — Other Ambulatory Visit: Payer: Self-pay

## 2020-12-26 ENCOUNTER — Ambulatory Visit (INDEPENDENT_AMBULATORY_CARE_PROVIDER_SITE_OTHER): Payer: Managed Care, Other (non HMO) | Admitting: Obstetrics and Gynecology

## 2020-12-26 VITALS — BP 120/80 | Wt 247.0 lb

## 2020-12-26 DIAGNOSIS — O0993 Supervision of high risk pregnancy, unspecified, third trimester: Secondary | ICD-10-CM

## 2020-12-26 DIAGNOSIS — O99210 Obesity complicating pregnancy, unspecified trimester: Secondary | ICD-10-CM

## 2020-12-26 DIAGNOSIS — Z8759 Personal history of other complications of pregnancy, childbirth and the puerperium: Secondary | ICD-10-CM

## 2020-12-26 DIAGNOSIS — Z3A34 34 weeks gestation of pregnancy: Secondary | ICD-10-CM

## 2020-12-26 DIAGNOSIS — O34219 Maternal care for unspecified type scar from previous cesarean delivery: Secondary | ICD-10-CM

## 2020-12-26 LAB — POCT URINALYSIS DIPSTICK OB
Glucose, UA: NEGATIVE
POC,PROTEIN,UA: NEGATIVE

## 2020-12-26 NOTE — Progress Notes (Signed)
Routine Prenatal Care Visit  Subjective  Kendra Hunter is a 30 y.o. G3P2002 at [redacted]w[redacted]d being seen today for ongoing prenatal care.  She is currently monitored for the following issues for this high-risk pregnancy and has Acute blood loss anemia; History of successful vaginal birth after cesarean, currently pregnant; Maternal obesity, antepartum; History of gestational hypertension; Supervision of high risk pregnancy, antepartum; History of cesarean delivery affecting pregnancy; and Labor and delivery, indication for care on their problem list.  ----------------------------------------------------------------------------------- Patient reports no complaints.   Contractions: Not present. Vag. Bleeding: None.  Movement: Present. Leaking Fluid denies.  ----------------------------------------------------------------------------------- The following portions of the patient's history were reviewed and updated as appropriate: allergies, current medications, past family history, past medical history, past social history, past surgical history and problem list. Problem list updated.  Objective  Blood pressure 120/80, weight 247 lb (112 kg), last menstrual period 04/19/2020. Pregravid weight 217 lb (98.4 kg) Total Weight Gain 30 lb (13.6 kg) Urinalysis: Urine Protein Negative  Urine Glucose Negative  Fetal Status: Fetal Heart Rate (bpm): 140   Movement: Present  Presentation: Vertex  General:  Alert, oriented and cooperative. Patient is in no acute distress.  Skin: Skin is warm and dry. No rash noted.   Cardiovascular: Normal heart rate noted  Respiratory: Normal respiratory effort, no problems with respiration noted  Abdomen: Soft, gravid, appropriate for gestational age. Pain/Pressure: Absent     Pelvic:  Cervical exam deferred        Extremities: Normal range of motion.     Mental Status: Normal mood and affect. Normal behavior. Normal judgment and thought content.   Assessment   30 y.o.  G3P2002 at [redacted]w[redacted]d by  02/01/2021, by Ultrasound presenting for routine prenatal visit  Plan   pregnancy 4 Problems (from 06/15/20 to present)     Problem Noted Resolved   History of cesarean delivery affecting pregnancy 11/15/2020 by Conard Novak, MD No   Maternal obesity, antepartum 06/15/2020 by Vena Austria, MD No   History of gestational hypertension 06/15/2020 by Vena Austria, MD No   Supervision of high risk pregnancy, antepartum 06/15/2020 by Vena Austria, MD No   Overview Addendum 09/06/2020 12:14 PM by Natale Milch, MD     Nursing Staff Provider  Office Location  [facility] Dating   7 wk [place]  Language  English Anatomy US  complete  Flu Vaccine  2021 Genetic Screen  NIPS: normal XY  TDaP vaccine    Hgb A1C or  GTT Early hgba1c: 4.9 Third trimester :   Covid  vaccinated   LAB RESULTS   Rhogam   not needed Blood Type O/Positive/-- (01/27 1030)   Feeding Plan  Breast Antibody Negative (01/27 1030)  Contraception  Mirena IUD Rubella 1.92 (01/27 1030)  Circumcision  RPR Non Reactive (01/27 1030)   Pediatrician   HBsAg Negative (01/27 1030)   Support Person  HIV Non Reactive (01/27 1030)  Prenatal Classes  Varicella  Immune    GBS  (For PCN allergy, check sensitivities)   BTL Consent     VBAC Consent [ ]  desires Pap   NIL 2021    Hgb Electro      CF      SMA        History of Cesarean Section- desires VBAC, hx of prior cesarean then successful VBAC      History of successful vaginal birth after cesarean, currently pregnant 02/19/2016 by 04/20/2016, CNM No   [redacted] weeks gestation of pregnancy  11/17/2020 by Tresea Mall, CNM 12/19/2020 by Conard Novak, MD        Preterm labor symptoms and general obstetric precautions including but not limited to vaginal bleeding, contractions, leaking of fluid and fetal movement were reviewed in detail with the patient. Please refer to After Visit Summary for other counseling recommendations.   Growth u/s  shows fetus in 55th%ile. AFI normal. Cephalic.  Return in about 1 week (around 01/02/2021) for multiple weekly appts for ROB.   Thomasene Mohair, MD, [facility], [facility] 12/26/2020 4:43 PM

## 2021-01-03 ENCOUNTER — Other Ambulatory Visit: Payer: Self-pay

## 2021-01-03 ENCOUNTER — Ambulatory Visit (INDEPENDENT_AMBULATORY_CARE_PROVIDER_SITE_OTHER): Payer: Managed Care, Other (non HMO) | Admitting: Obstetrics and Gynecology

## 2021-01-03 VITALS — BP 139/86 | Wt 249.0 lb

## 2021-01-03 DIAGNOSIS — Z3A35 35 weeks gestation of pregnancy: Secondary | ICD-10-CM

## 2021-01-03 DIAGNOSIS — O99210 Obesity complicating pregnancy, unspecified trimester: Secondary | ICD-10-CM

## 2021-01-03 DIAGNOSIS — O0990 Supervision of high risk pregnancy, unspecified, unspecified trimester: Secondary | ICD-10-CM

## 2021-01-03 DIAGNOSIS — O34219 Maternal care for unspecified type scar from previous cesarean delivery: Secondary | ICD-10-CM

## 2021-01-03 DIAGNOSIS — Z8759 Personal history of other complications of pregnancy, childbirth and the puerperium: Secondary | ICD-10-CM

## 2021-01-03 LAB — POCT URINALYSIS DIPSTICK OB
Glucose, UA: NEGATIVE
POC,PROTEIN,UA: NEGATIVE

## 2021-01-03 NOTE — Progress Notes (Signed)
ROB - tripped and fell coming across the parking lot, scrapped Lt elbow, Rt hand and knee. RM 2

## 2021-01-03 NOTE — Progress Notes (Signed)
Routine Prenatal Care Visit  Subjective  Kendra Hunter is a 30 y.o. G3P2002 at [redacted]w[redacted]d being seen today for ongoing prenatal care.  She is currently monitored for the following issues for this high-risk pregnancy and has Acute blood loss anemia; History of successful vaginal birth after cesarean, currently pregnant; Maternal obesity, antepartum; History of gestational hypertension; Supervision of high risk pregnancy, antepartum; History of cesarean delivery affecting pregnancy; and Labor and delivery, indication for care on their problem list.  ----------------------------------------------------------------------------------- Patient reports no complaints.    .  .   . Denies leaking of fluid.  ----------------------------------------------------------------------------------- The following portions of the patient's history were reviewed and updated as appropriate: allergies, current medications, past family history, past medical history, past social history, past surgical history and problem list. Problem list updated.   Objective  Blood pressure 139/86, weight 249 lb (112.9 kg), last menstrual period 04/19/2020. Pregravid weight 217 lb (98.4 kg) Total Weight Gain 32 lb (14.5 kg) Urinalysis:      Fetal Status:           General:  Alert, oriented and cooperative. Patient is in no acute distress.  Skin: Skin is warm and dry. No rash noted.   Cardiovascular: Normal heart rate noted  Respiratory: Normal respiratory effort, no problems with respiration noted  Abdomen: Soft, gravid, appropriate for gestational age.       Pelvic:  Cervical exam deferred        Extremities: Normal range of motion.     ental Status: Normal mood and affect. Normal behavior. Normal judgment and thought content.     Assessment   30 y.o. G3P2002 at [redacted]w[redacted]d by  02/01/2021, by Ultrasound presenting for routine prenatal visit  Plan   pregnancy 4 Problems (from 06/15/20 to present)     Problem Noted  Resolved   History of cesarean delivery affecting pregnancy 11/15/2020 by Conard Novak, MD No   Maternal obesity, antepartum 06/15/2020 by Vena Austria, MD No   History of gestational hypertension 06/15/2020 by Vena Austria, MD No   Supervision of high risk pregnancy, antepartum 06/15/2020 by Vena Austria, MD No   Overview Addendum 09/06/2020 12:14 PM by Natale Milch, MD     Nursing Staff Provider  Office Location  [facility] Dating   7 wk [place]  Language  English Anatomy US  complete  Flu Vaccine  2021 Genetic Screen  NIPS: normal XY  TDaP vaccine    Hgb A1C or  GTT Early hgba1c: 4.9 Third trimester :   Covid  vaccinated   LAB RESULTS   Rhogam   not needed Blood Type O/Positive/-- (01/27 1030)   Feeding Plan  Breast Antibody Negative (01/27 1030)  Contraception  Mirena IUD Rubella 1.92 (01/27 1030)  Circumcision  RPR Non Reactive (01/27 1030)   Pediatrician   HBsAg Negative (01/27 1030)   Support Person  HIV Non Reactive (01/27 1030)  Prenatal Classes  Varicella  Immune    GBS  (For PCN allergy, check sensitivities)   BTL Consent     VBAC Consent [ ]  desires Pap   NIL 2021    Hgb Electro      CF      SMA        History of Cesarean Section- desires VBAC, hx of prior cesarean then successful VBAC      History of successful vaginal birth after cesarean, currently pregnant 02/19/2016 by 04/20/2016, CNM No   [redacted] weeks gestation of pregnancy 11/17/2020 by  Tresea Mall, CNM 12/19/2020 by Conard Novak, MD        Gestational age appropriate obstetric precautions including but not limited to vaginal bleeding, contractions, leaking of fluid and fetal movement were reviewed in detail with the patient.    GBS next visit  Return in about 1 week (around 01/10/2021) for weekly ROB next 3 weeks.  Vena Austria, MD, [facility], [facility] 01/03/2021, 2:38 PM

## 2021-01-12 ENCOUNTER — Other Ambulatory Visit: Payer: Self-pay

## 2021-01-12 ENCOUNTER — Ambulatory Visit (INDEPENDENT_AMBULATORY_CARE_PROVIDER_SITE_OTHER): Payer: Managed Care, Other (non HMO) | Admitting: Obstetrics

## 2021-01-12 VITALS — BP 132/80 | Wt 251.0 lb

## 2021-01-12 DIAGNOSIS — Z3A37 37 weeks gestation of pregnancy: Secondary | ICD-10-CM

## 2021-01-12 DIAGNOSIS — O0990 Supervision of high risk pregnancy, unspecified, unspecified trimester: Secondary | ICD-10-CM

## 2021-01-12 NOTE — Addendum Note (Signed)
Addended by: Mirna Mires on: 01/12/2021 08:34 AM   Modules accepted: Orders

## 2021-01-12 NOTE — Progress Notes (Signed)
Routine Prenatal Care Visit  Subjective  Kendra Hunter is a 30 y.o. G3P2002 at [redacted]w[redacted]d being seen today for ongoing prenatal care.  She is currently monitored for the following issues for this high-risk pregnancy and has Acute blood loss anemia; History of successful vaginal birth after cesarean, currently pregnant; Maternal obesity, antepartum; History of gestational hypertension; Supervision of high risk pregnancy, antepartum; and History of cesarean delivery affecting pregnancy on their problem list.  ----------------------------------------------------------------------------------- Patient reports no complaints.    .  .   Pincus Large Fluid denies.  ----------------------------------------------------------------------------------- The following portions of the patient's history were reviewed and updated as appropriate: allergies, current medications, past family history, past medical history, past social history, past surgical history and problem list. Problem list updated.  Objective  Blood pressure 132/80, weight 251 lb (113.9 kg), last menstrual period 04/19/2020. Pregravid weight 217 lb (98.4 kg) Total Weight Gain 34 lb (15.4 kg) Urinalysis: Urine Protein    Urine Glucose    Fetal Status:           General:  Alert, oriented and cooperative. Patient is in no acute distress.  Skin: Skin is warm and dry. No rash noted.   Cardiovascular: Normal heart rate noted  Respiratory: Normal respiratory effort, no problems with respiration noted  Abdomen: Soft, gravid, appropriate for gestational age.       Pelvic:  Cervical exam performed      1.5cms/thick/high. vertex verified with sono  Extremities: Normal range of motion.     Mental Status: Normal mood and affect. Normal behavior. Normal judgment and thought content.   Assessment   30 y.o. G3P2002 at [redacted]w[redacted]d by  02/01/2021, by Ultrasound presenting for routine prenatal visit  Plan   pregnancy 4 Problems (from 06/15/20 to present)     Problem Noted Resolved   History of cesarean delivery affecting pregnancy 11/15/2020 by Conard Novak, MD No   Maternal obesity, antepartum 06/15/2020 by Vena Austria, MD No   History of gestational hypertension 06/15/2020 by Vena Austria, MD No   Supervision of high risk pregnancy, antepartum 06/15/2020 by Vena Austria, MD No   Overview Addendum 09/06/2020 12:14 PM by Natale Milch, MD     Nursing Staff Provider  Office Location  [facility] Dating   7 wk [place]  Language  English Anatomy US  complete  Flu Vaccine  2021 Genetic Screen  NIPS: normal XY  TDaP vaccine    Hgb A1C or  GTT Early hgba1c: 4.9 Third trimester :   Covid  vaccinated   LAB RESULTS   Rhogam   not needed Blood Type O/Positive/-- (01/27 1030)   Feeding Plan  Breast Antibody Negative (01/27 1030)  Contraception  Mirena IUD Rubella 1.92 (01/27 1030)  Circumcision  RPR Non Reactive (01/27 1030)   Pediatrician   HBsAg Negative (01/27 1030)   Support Person  HIV Non Reactive (01/27 1030)  Prenatal Classes  Varicella  Immune    GBS  (For PCN allergy, check sensitivities)   BTL Consent     VBAC Consent [ ]  desires Pap   NIL 2021    Hgb Electro      CF      SMA         History of Cesarean Section- desires VBAC, hx of prior cesarean then successful VBAC      History of successful vaginal birth after cesarean, currently pregnant 02/19/2016 by 04/20/2016, CNM No   [redacted] weeks gestation of pregnancy 11/17/2020 by 01/18/2021, CNM  12/19/2020 by Conard Novak, MD       Term labor symptoms and general obstetric precautions including but not limited to vaginal bleeding, contractions, leaking of fluid and fetal movement were reviewed in detail with the patient. Please refer to After Visit Summary for other counseling recommendations.   Return in about 1 week (around 01/19/2021) for return OB.  Mirna Mires, CNM  01/12/2021 8:33 AM

## 2021-01-12 NOTE — Progress Notes (Signed)
ROB - GBS, requesting cervical exam. RM 4

## 2021-01-16 LAB — CULTURE, BETA STREP (GROUP B ONLY): Strep Gp B Culture: NEGATIVE

## 2021-01-18 ENCOUNTER — Other Ambulatory Visit: Payer: Self-pay

## 2021-01-18 ENCOUNTER — Encounter: Payer: Managed Care, Other (non HMO) | Admitting: Advanced Practice Midwife

## 2021-01-18 ENCOUNTER — Ambulatory Visit (INDEPENDENT_AMBULATORY_CARE_PROVIDER_SITE_OTHER): Payer: BC Managed Care – PPO | Admitting: Obstetrics and Gynecology

## 2021-01-18 VITALS — BP 122/80 | Wt 251.0 lb

## 2021-01-18 DIAGNOSIS — O0990 Supervision of high risk pregnancy, unspecified, unspecified trimester: Secondary | ICD-10-CM

## 2021-01-18 DIAGNOSIS — O99210 Obesity complicating pregnancy, unspecified trimester: Secondary | ICD-10-CM

## 2021-01-18 DIAGNOSIS — O34219 Maternal care for unspecified type scar from previous cesarean delivery: Secondary | ICD-10-CM

## 2021-01-18 DIAGNOSIS — Z3A38 38 weeks gestation of pregnancy: Secondary | ICD-10-CM

## 2021-01-18 LAB — POCT URINALYSIS DIPSTICK OB
Glucose, UA: NEGATIVE
POC,PROTEIN,UA: NEGATIVE

## 2021-01-18 NOTE — Progress Notes (Signed)
Routine Prenatal Care Visit  Subjective  Kendra Hunter is a 30 y.o. G3P2002 at [redacted]w[redacted]d being seen today for ongoing prenatal care.  She is currently monitored for the following issues for this low-risk pregnancy and has Acute blood loss anemia; History of successful vaginal birth after cesarean, currently pregnant; Maternal obesity, antepartum; History of gestational hypertension; Supervision of high risk pregnancy, antepartum; and History of cesarean delivery affecting pregnancy on their problem list.  ----------------------------------------------------------------------------------- Patient reports no complaints.   Contractions: Irregular. Vag. Bleeding: None.  Movement: Present. Denies leaking of fluid.  ----------------------------------------------------------------------------------- The following portions of the patient's history were reviewed and updated as appropriate: allergies, current medications, past family history, past medical history, past social history, past surgical history and problem list. Problem list updated.   Objective  Blood pressure 122/80, weight 251 lb (113.9 kg), last menstrual period 04/19/2020. Pregravid weight 217 lb (98.4 kg) Total Weight Gain 34 lb (15.4 kg) Urinalysis:      Fetal Status: Fetal Heart Rate (bpm): 145 Fundal Height: 37 cm Movement: Present  Presentation: Vertex  General:  Alert, oriented and cooperative. Patient is in no acute distress.  Skin: Skin is warm and dry. No rash noted.   Cardiovascular: Normal heart rate noted  Respiratory: Normal respiratory effort, no problems with respiration noted  Abdomen: Soft, gravid, appropriate for gestational age. Pain/Pressure: Present     Pelvic:  Cervical exam deferred        Extremities: Normal range of motion.     ental Status: Normal mood and affect. Normal behavior. Normal judgment and thought content.     Assessment   30 y.o. G3P2002 at [redacted]w[redacted]d by  02/01/2021, by Ultrasound presenting  for routine prenatal visit  Plan   pregnancy 4 Problems (from 06/15/20 to present)     Problem Noted Resolved   History of cesarean delivery affecting pregnancy 11/15/2020 by Conard Novak, MD No   Maternal obesity, antepartum 06/15/2020 by Vena Austria, MD No   History of gestational hypertension 06/15/2020 by Vena Austria, MD No   Supervision of high risk pregnancy, antepartum 06/15/2020 by Vena Austria, MD No   Overview Addendum 01/16/2021 12:54 PM by Mirna Mires, CNM     Nursing Staff Provider  Office Location  [facility] Dating   7 wk [place]  Language  English Anatomy US  complete  Flu Vaccine  2021 Genetic Screen  NIPS: normal XY  TDaP vaccine    Hgb A1C or  GTT Early hgba1c: 4.9 Third trimester :   Covid  vaccinated   LAB RESULTS   Rhogam   not needed Blood Type O/Positive/-- (01/27 1030)   Feeding Plan  Breast Antibody Negative (01/27 1030)  Contraception  Mirena IUD Rubella 1.92 (01/27 1030)  Circumcision  RPR Non Reactive (01/27 1030)   Pediatrician   HBsAg Negative (01/27 1030)   Support Person  HIV Non Reactive (01/27 1030)  Prenatal Classes  Varicella  Immune    GBS  (For PCN allergy, check sensitivities) negative  BTL Consent     VBAC Consent [ ]  desires Pap   NIL 2021    Hgb Electro      CF      SMA        History of Cesarean Section- desires VBAC, hx of prior cesarean then successful VBAC      History of successful vaginal birth after cesarean, currently pregnant 02/19/2016 by 04/20/2016, CNM No   [redacted] weeks gestation of pregnancy 11/17/2020 by  Tresea Mall, CNM 12/19/2020 by Conard Novak, MD        Gestational age appropriate obstetric precautions including but not limited to vaginal bleeding, contractions, leaking of fluid and fetal movement were reviewed in detail with the patient.    Discussed RLTCS at 40 vs 41 weeks if not in labor, vs potential of IOL if favorable cervix.  Also discussed membrane sweep at 39 weeks  Return  in about 1 week (around 01/25/2021) for ROB.  Vena Austria, MD, [facility], [facility] 01/18/2021, 3:41 PM

## 2021-01-18 NOTE — Progress Notes (Signed)
ROB - no concerns. RM 2 

## 2021-01-22 ENCOUNTER — Encounter: Payer: Self-pay | Admitting: Obstetrics and Gynecology

## 2021-01-22 ENCOUNTER — Observation Stay
Admission: EM | Admit: 2021-01-22 | Discharge: 2021-01-22 | Disposition: A | Discharge status: Home / Self Care | Payer: BC Managed Care – PPO | Attending: Advanced Practice Midwife | Admitting: Advanced Practice Midwife

## 2021-01-22 ENCOUNTER — Other Ambulatory Visit: Payer: Self-pay

## 2021-01-22 DIAGNOSIS — O99210 Obesity complicating pregnancy, unspecified trimester: Secondary | ICD-10-CM | POA: Diagnosis present

## 2021-01-22 DIAGNOSIS — O99213 Obesity complicating pregnancy, third trimester: Secondary | ICD-10-CM

## 2021-01-22 DIAGNOSIS — O26893 Other specified pregnancy related conditions, third trimester: Secondary | ICD-10-CM

## 2021-01-22 DIAGNOSIS — O133 Gestational [pregnancy-induced] hypertension without significant proteinuria, third trimester: Secondary | ICD-10-CM | POA: Diagnosis not present

## 2021-01-22 DIAGNOSIS — Z3A38 38 weeks gestation of pregnancy: Secondary | ICD-10-CM

## 2021-01-22 DIAGNOSIS — R109 Unspecified abdominal pain: Secondary | ICD-10-CM

## 2021-01-22 DIAGNOSIS — Z8759 Personal history of other complications of pregnancy, childbirth and the puerperium: Secondary | ICD-10-CM

## 2021-01-22 DIAGNOSIS — O471 False labor at or after 37 completed weeks of gestation: Principal | ICD-10-CM

## 2021-01-22 DIAGNOSIS — E669 Obesity, unspecified: Secondary | ICD-10-CM | POA: Diagnosis not present

## 2021-01-22 DIAGNOSIS — O34219 Maternal care for unspecified type scar from previous cesarean delivery: Secondary | ICD-10-CM

## 2021-01-22 DIAGNOSIS — O0990 Supervision of high risk pregnancy, unspecified, unspecified trimester: Secondary | ICD-10-CM

## 2021-01-22 NOTE — OB Triage Note (Signed)
Pt G3P2 [redacted]w[redacted]d [facility] w/ c/o ctx that started around 9:30PM last night. Rating the pain 5/10. Reports no vag bleeding, no LOF, and +FM. VSS. Monitors applied, initial FHT's in 140's.

## 2021-01-22 NOTE — Discharge Summary (Signed)
Physician Final Progress Note  Patient ID: Kendra Hunter MRN: [id number] DOB/AGE: [birth year] 28 y.o.  Admit date: 01/22/2021 Admitting provider: Tresea Mall, CNM Discharge date: 01/22/2021   Admission Diagnoses:  1) intrauterine pregnancy at [redacted]w[redacted]d  2) contractions  Discharge Diagnoses:  Principal Problem:   Supervision of high risk pregnancy, antepartum Active Problems:   Maternal obesity, antepartum   Labor and delivery, indication for care   [redacted] weeks gestation of pregnancy    History of Present Illness: The patient is a 30 y.o. female G3P2002 at [redacted]w[redacted]d who presents for contractions that started about 6 hours ago. She denies vaginal bleeding or leakage of fluid. She reports good fetal movement. She is comfortable and speaking through contractions per RN report. No change in cervical exam since 4 days ago per RN check. The patient is comfortable with discharge to home and she received instructions and precautions for when to return.   Past Medical History:  Diagnosis Date   Acute blood loss anemia 03/31/2013   Hypertension in pregnancy 03/29/2013   Postpartum care following cesarean delivery 03/31/2013   S/P primary low transverse C-section 03/31/2013    Past Surgical History:  Procedure Laterality Date   CESAREAN SECTION N/A 03/30/2013   Procedure: Primary Cesarean Section Delivery Baby boy @ 2321, Apgars 9/9;  Surgeon: Robley Fries, MD;  Location: [facility];  Service: Obstetrics;  Laterality: N/A;   CHOLECYSTECTOMY     WISDOM TOOTH EXTRACTION      No current facility-administered medications on file prior to encounter.   Current Outpatient Medications on File Prior to Encounter  Medication Sig Dispense Refill   omeprazole (PRILOSEC) 20 MG capsule Take 1 capsule (20 mg total) by mouth daily. 90 capsule 3   Prenatal Vit-Fe Fumarate-FA (MULTIVITAMIN-PRENATAL) 27-0.8 MG TABS tablet Take 1 tablet by mouth daily at 12 noon.     magnesium oxide (MAG-OX) 400 MG tablet Take  400 mg by mouth daily. (Patient not taking: No sig reported)      No Known Allergies  Social History   Socioeconomic History   Marital status: Married    Spouse name: De Nurse   Number of children: Not on file   Years of education: Not on file   Highest education level: Not on file  Occupational History   Not on file  Tobacco Use   Smoking status: Never   Smokeless tobacco: Never  Vaping Use   Vaping Use: Never used  Substance and Sexual Activity   Alcohol use: No   Drug use: No   Sexual activity: Yes    Birth control/protection: I.U.D.  Other Topics Concern   Not on file  Social History Narrative   Not on file   Social Determinants of Health   Financial Resource Strain: Not on file  Food Insecurity: Not on file  Transportation Needs: Not on file  Physical Activity: Not on file  Stress: Not on file  Social Connections: Not on file  Intimate Partner Violence: Not on file    History reviewed. No pertinent family history.   Review of Systems  Constitutional:  Negative for chills and fever.  HENT:  Negative for congestion, ear discharge, ear pain, hearing loss, sinus pain and sore throat.   Eyes:  Negative for blurred vision and double vision.  Respiratory:  Negative for cough, shortness of breath and wheezing.   Cardiovascular:  Negative for chest pain, palpitations and leg swelling.  Gastrointestinal:  Positive for abdominal pain. Negative for blood in stool, constipation, diarrhea,  heartburn, melena, nausea and vomiting.  Genitourinary:  Negative for dysuria, flank pain, frequency, hematuria and urgency.  Musculoskeletal:  Negative for back pain, joint pain and myalgias.  Skin:  Negative for itching and rash.  Neurological:  Negative for dizziness, tingling, tremors, sensory change, speech change, focal weakness, seizures, loss of consciousness, weakness and headaches.  Endo/Heme/Allergies:  Negative for environmental allergies. Does not bruise/bleed easily.   Psychiatric/Behavioral:  Negative for depression, hallucinations, memory loss, substance abuse and suicidal ideas. The patient is not nervous/anxious and does not have insomnia.     Physical Exam: BP 125/77 (BP Location: Left Arm)   Pulse 92   Temp 98 F (36.7 C) (Oral)   Resp 18   LMP 04/19/2020   Cardiovascular: Regular rate and rhythm.   Respiratory:  Normal respiratory effort Abdomen: FHT present  Pelvic exam: per RN K. Lendell Caprice  Consults: none  Significant Findings/ Diagnostic Studies: none  Procedures: NST  Hospital Course: The patient was admitted to Labor and Delivery Triage for observation.   Discharge Condition: good  Disposition: Discharge disposition: 01-Home or Self Care  Diet: Regular diet  Discharge Activity: Activity as tolerated  Discharge Instructions     Discharge activity:  No Restrictions   Complete by: As directed    Discharge diet:  No restrictions   Complete by: As directed    LABOR:  When conractions begin, you should start to time them from the beginning of one contraction to the beginning  of the next.  When contractions are 5 - 10 minutes apart or less and have been regular for at least an hour, you should call your health care provider.   Complete by: As directed    No sexual activity restrictions   Complete by: As directed    Notify physician for bleeding from the vagina   Complete by: As directed    Notify physician for blurring of vision or spots before the eyes   Complete by: As directed    Notify physician for chills or fever   Complete by: As directed    Notify physician for fainting spells, "black outs" or loss of consciousness   Complete by: As directed    Notify physician for increase in vaginal discharge   Complete by: As directed    Notify physician for leaking of fluid   Complete by: As directed    Notify physician for pain or burning when urinating   Complete by: As directed    Notify physician for pelvic pressure  (sudden increase)   Complete by: As directed    Notify physician for severe or continued nausea or vomiting   Complete by: As directed    Notify physician for sudden gushing of fluid from the vagina (with or without continued leaking)   Complete by: As directed    Notify physician for sudden, constant, or occasional abdominal pain   Complete by: As directed    Notify physician if baby moving less than usual   Complete by: As directed       Allergies as of 01/22/2021   No Known Allergies      Medication List     STOP taking these medications    magnesium oxide 400 MG tablet Commonly known as: MAG-OX       TAKE these medications    multivitamin-prenatal 27-0.8 MG Tabs tablet Take 1 tablet by mouth daily at 12 noon.   omeprazole 20 MG capsule Commonly known as: PRILOSEC Take 1 capsule (20 mg total)  by mouth daily.        Follow-up Information     [facility]. Go to.   Specialty: Obstetrics and Gynecology Why: scheduled prenatal appointment Contact information: [address] [place] [address] [phone]                Total time spent taking care of this patient: 12 minutes  Signed: Tresea Mall, CNM  01/22/2021, 3:15 AM

## 2021-01-25 ENCOUNTER — Ambulatory Visit (INDEPENDENT_AMBULATORY_CARE_PROVIDER_SITE_OTHER): Payer: BC Managed Care – PPO | Admitting: Obstetrics and Gynecology

## 2021-01-25 ENCOUNTER — Encounter: Payer: Self-pay | Admitting: Obstetrics and Gynecology

## 2021-01-25 ENCOUNTER — Other Ambulatory Visit: Payer: Self-pay

## 2021-01-25 ENCOUNTER — Encounter: Payer: Managed Care, Other (non HMO) | Admitting: Obstetrics and Gynecology

## 2021-01-25 VITALS — BP 131/85 | Wt 252.0 lb

## 2021-01-25 DIAGNOSIS — Z8759 Personal history of other complications of pregnancy, childbirth and the puerperium: Secondary | ICD-10-CM | POA: Diagnosis not present

## 2021-01-25 DIAGNOSIS — O99210 Obesity complicating pregnancy, unspecified trimester: Secondary | ICD-10-CM

## 2021-01-25 DIAGNOSIS — O0990 Supervision of high risk pregnancy, unspecified, unspecified trimester: Secondary | ICD-10-CM

## 2021-01-25 DIAGNOSIS — O34219 Maternal care for unspecified type scar from previous cesarean delivery: Secondary | ICD-10-CM | POA: Diagnosis not present

## 2021-01-25 DIAGNOSIS — O26893 Other specified pregnancy related conditions, third trimester: Secondary | ICD-10-CM

## 2021-01-25 NOTE — Progress Notes (Signed)
Routine Prenatal Care Visit  Subjective  Kendra Hunter is a 30 y.o. G3P2002 at [redacted]w[redacted]d being seen today for ongoing prenatal care.  She is currently monitored for the following issues for this low-risk pregnancy and has Acute blood loss anemia; History of successful vaginal birth after cesarean, currently pregnant; Maternal obesity, antepartum; History of gestational hypertension; Supervision of high risk pregnancy, antepartum; History of cesarean delivery affecting pregnancy; Labor and delivery, indication for care; and Abdominal pain during pregnancy in third trimester on their problem list.  ----------------------------------------------------------------------------------- Patient reports no complaints.   Contractions: Irregular. Vag. Bleeding: None.  Movement: Present. Leaking Fluid denies.  Denies current headache, visual changes, and RUQ pain ----------------------------------------------------------------------------------- The following portions of the patient's history were reviewed and updated as appropriate: allergies, current medications, past family history, past medical history, past social history, past surgical history and problem list. Problem list updated.  Objective  Blood pressure 131/85, weight 252 lb (114.3 kg), last menstrual period 04/19/2020. Pregravid weight 217 lb (98.4 kg) Total Weight Gain 35 lb (15.9 kg) Urinalysis: Urine Protein    Urine Glucose    Fetal Status: Fetal Heart Rate (bpm): 150 Fundal Height: 39 cm Movement: Present  Presentation: Vertex  General:  Alert, oriented and cooperative. Patient is in no acute distress.  Skin: Skin is warm and dry. No rash noted.   Cardiovascular: Normal heart rate noted  Respiratory: Normal respiratory effort, no problems with respiration noted  Abdomen: Soft, gravid, appropriate for gestational age. Pain/Pressure: Present     Pelvic:  Cervical exam performed Dilation: 1.5 Effacement (%): 40 Station: Ballotable   Extremities: Normal range of motion.     Mental Status: Normal mood and affect. Normal behavior. Normal judgment and thought content.   Female chaperone present for pelvic exam  Assessment   30 y.o. [id number] at [redacted]w[redacted]d by  02/01/2021, by Ultrasound presenting for routine prenatal visit  Plan   pregnancy 4 Problems (from 06/15/20 to present)     Problem Noted Resolved   Abdominal pain during pregnancy in third trimester 01/25/2021 by Tresea Mall, CNM No   History of cesarean delivery affecting pregnancy 11/15/2020 by Conard Novak, MD No   Maternal obesity, antepartum 06/15/2020 by Vena Austria, MD No   History of gestational hypertension 06/15/2020 by Vena Austria, MD No   Supervision of high risk pregnancy, antepartum 06/15/2020 by Vena Austria, MD No   Overview Addendum 01/16/2021 12:54 PM by Mirna Mires, CNM     Nursing Staff Provider  Office Location  [facility] Dating   7 wk [place]  Language  English Anatomy US  complete  Flu Vaccine  2021 Genetic Screen  NIPS: normal XY  TDaP vaccine    Hgb A1C or  GTT Early hgba1c: 4.9 Third trimester :   Covid  vaccinated   LAB RESULTS   Rhogam   not needed Blood Type O/Positive/-- (01/27 1030)   Feeding Plan  Breast Antibody Negative (01/27 1030)  Contraception  Mirena IUD Rubella 1.92 (01/27 1030)  Circumcision  RPR Non Reactive (01/27 1030)   Pediatrician   HBsAg Negative (01/27 1030)   Support Person  HIV Non Reactive (01/27 1030)  Prenatal Classes  Varicella  Immune    GBS  (For PCN allergy, check sensitivities) negative  BTL Consent     VBAC Consent [ ]  desires Pap   NIL 2021    Hgb Electro      CF      SMA  History of Cesarean Section- desires VBAC, hx of prior cesarean then successful VBAC      History of successful vaginal birth after cesarean, currently pregnant 02/19/2016 by Farrel Conners, CNM No   [redacted] weeks gestation of pregnancy 01/22/2021 by Tresea Mall, CNM 01/25/2021 by Conard Novak,  MD   [redacted] weeks gestation of pregnancy 11/17/2020 by Tresea Mall, CNM 12/19/2020 by Conard Novak, MD        Term labor symptoms and general obstetric precautions including but not limited to vaginal bleeding, contractions, leaking of fluid and fetal movement were reviewed in detail with the patient. Please refer to After Visit Summary for other counseling recommendations.   Initial BP elevated when arrived. However, retake was normal. Discussed with patient and gave strict precautions to go to L&D for HA, visual changes, RUQ pain, any concerning symptoms.  She should also take her BP at home if possible. For >140/90, should go to L&D.  Return in about 1 week (around 02/01/2021) for ROB.   Thomasene Mohair, MD, [facility], [facility] 01/25/2021 4:55 PM

## 2021-01-28 ENCOUNTER — Encounter: Payer: Self-pay | Admitting: Obstetrics and Gynecology

## 2021-01-28 ENCOUNTER — Other Ambulatory Visit: Payer: Self-pay

## 2021-01-28 ENCOUNTER — Observation Stay
Admission: EM | Admit: 2021-01-28 | Discharge: 2021-01-29 | Disposition: A | Discharge status: Home / Self Care | Payer: BC Managed Care – PPO | Attending: Obstetrics and Gynecology | Admitting: Obstetrics and Gynecology

## 2021-01-28 DIAGNOSIS — Z8759 Personal history of other complications of pregnancy, childbirth and the puerperium: Secondary | ICD-10-CM

## 2021-01-28 DIAGNOSIS — O133 Gestational [pregnancy-induced] hypertension without significant proteinuria, third trimester: Principal | ICD-10-CM | POA: Insufficient documentation

## 2021-01-28 DIAGNOSIS — O0990 Supervision of high risk pregnancy, unspecified, unspecified trimester: Secondary | ICD-10-CM

## 2021-01-28 DIAGNOSIS — O34219 Maternal care for unspecified type scar from previous cesarean delivery: Secondary | ICD-10-CM

## 2021-01-28 DIAGNOSIS — Z3A39 39 weeks gestation of pregnancy: Secondary | ICD-10-CM | POA: Diagnosis not present

## 2021-01-28 DIAGNOSIS — O26893 Other specified pregnancy related conditions, third trimester: Secondary | ICD-10-CM

## 2021-01-28 DIAGNOSIS — O99210 Obesity complicating pregnancy, unspecified trimester: Secondary | ICD-10-CM

## 2021-01-28 NOTE — OB Triage Note (Signed)
Kendra Hunter is a 30yo G3P2,  39w 3d. She arrived to the unit with complaints of  increased blood pressure. She denies right epigastric pain, headache at this time, and visual disturbances. She reports positive fetal movement. VS stable and external fetal monitors applied and assessing.   Initial FHT 145 at 2300.

## 2021-01-29 ENCOUNTER — Encounter: Payer: BC Managed Care – PPO | Admitting: Advanced Practice Midwife

## 2021-01-29 DIAGNOSIS — O133 Gestational [pregnancy-induced] hypertension without significant proteinuria, third trimester: Secondary | ICD-10-CM | POA: Diagnosis not present

## 2021-01-29 LAB — PROTEIN / CREATININE RATIO, URINE
Creatinine, Urine: 72 mg/dL
Protein Creatinine Ratio: 0.1 mg/mg{Cre} (ref 0.00–0.15)
Total Protein, Urine: 7 mg/dL

## 2021-01-29 MED ORDER — ACETAMINOPHEN 325 MG PO TABS: 650.0000 mg | ORAL_TABLET | ORAL | Status: DC | PRN

## 2021-01-29 NOTE — Discharge Summary (Signed)
Physician Final Progress Note  Patient ID: GWENDY BOEDER MRN: [id number] DOB/AGE: 1990/08/04 30 y.o.  Admit date: 01/28/2021 Admitting provider: Vena Austria, MD Discharge date: 01/29/2021   Admission Diagnoses: Labor and delivery indication for care or intervention  Discharge Diagnoses:  Active Problems:   Labor and delivery indication for care or intervention  30 y.o. G3P2002 at [redacted]w[redacted]d with history of preeclampsia who on home BP checks noted a BP in the 140's.  BP here on presentation normotensive, with normal P/C ratio.  Patient was ready to be discharged when she has a brief 10 minute period of fetal tachycardia followed by a spontaneous deceleration.  Patient was monitored for 6 hrs with no further deceleration noted and a reactive fetal heart rate tracing/category I tracing.  No contractions cervix 2/70/-3.  Blood pressure 119/71, pulse 79, temperature 97.8 F (36.6 C), temperature source Oral, resp. rate 16, height 5\' 5"  (1.651 m), weight 113.4 kg, last menstrual period 04/19/2020.  pregnancy 4 Problems (from 06/15/20 to present)     Problem Noted Resolved   Abdominal pain during pregnancy in third trimester 01/25/2021 by 03/27/2021, CNM No   History of cesarean delivery affecting pregnancy 11/15/2020 by 11/17/2020, MD No   Maternal obesity, antepartum 06/15/2020 by 06/17/2020, MD No   History of gestational hypertension 06/15/2020 by 06/17/2020, MD No   Supervision of high risk pregnancy, antepartum 06/15/2020 by 06/17/2020, MD No   Overview Addendum 01/16/2021 12:54 PM by 01/18/2021, CNM     Nursing Staff Provider  Office Location  [facility] Dating   7 wk Mirna Mires  Language  English Anatomy US  complete  Flu Vaccine  2021 Genetic Screen  NIPS: normal XY  TDaP vaccine    Hgb A1C or  GTT Early hgba1c: 4.9 Third trimester :   Covid  vaccinated   LAB RESULTS   Rhogam   not needed Blood Type O/Positive/-- (01/27 1030)   Feeding Plan  Breast  Antibody Negative (01/27 1030)  Contraception  Mirena IUD Rubella 1.92 (01/27 1030)  Circumcision  RPR Non Reactive (01/27 1030)   Pediatrician   HBsAg Negative (01/27 1030)   Support Person  HIV Non Reactive (01/27 1030)  Prenatal Classes  Varicella  Immune    GBS  (For PCN allergy, check sensitivities) negative  BTL Consent     VBAC Consent [ ]  desires Pap   NIL 2021    Hgb Electro      CF      SMA        History of Cesarean Section- desires VBAC, hx of prior cesarean then successful VBAC      History of successful vaginal birth after cesarean, currently pregnant 02/19/2016 by 2022, CNM No   [redacted] weeks gestation of pregnancy 01/22/2021 by Farrel Conners, CNM 01/25/2021 by Tresea Mall, MD   [redacted] weeks gestation of pregnancy 11/17/2020 by Conard Novak, CNM 12/19/2020 by Tresea Mall, MD        Consults: None  Significant Findings/ Diagnostic Studies:  Results for orders placed or performed during the hospital encounter of 01/28/21 (from the past 24 hour(s))  Protein / creatinine ratio, urine     Status: None   Collection Time: 01/28/21 11:19 PM  Result Value Ref Range   Creatinine, Urine 72 mg/dL   Total Protein, Urine 7 mg/dL   Protein Creatinine Ratio 0.10 0.00 - 0.15 mg/mg[Cre]     Procedures:  Baseline: 130 Variability: moderate Accelerations: present  Decelerations: absent Tocometry: none The patient was monitored for 6-hrs, fetal heart rate tracing was deemed reactive, category I tracing,  Discharge Condition: good  Disposition: Discharge disposition: 01-Home or Self Care       Diet: Regular diet  Discharge Activity: Activity as tolerated  Discharge Instructions     Discharge activity:  No Restrictions   Complete by: As directed    Discharge diet:  No restrictions   Complete by: As directed    Fetal Kick Count:  Lie on our left side for one hour after a meal, and count the number of times your baby kicks.  If it is less than 5  times, get up, move around and drink some juice.  Repeat the test 30 minutes later.  If it is still less than 5 kicks in an hour, notify your doctor.   Complete by: As directed    LABOR:  When conractions begin, you should start to time them from the beginning of one contraction to the beginning  of the next.  When contractions are 5 - 10 minutes apart or less and have been regular for at least an hour, you should call your health care provider.   Complete by: As directed    No sexual activity restrictions   Complete by: As directed    Notify physician for bleeding from the vagina   Complete by: As directed    Notify physician for blurring of vision or spots before the eyes   Complete by: As directed    Notify physician for chills or fever   Complete by: As directed    Notify physician for fainting spells, "black outs" or loss of consciousness   Complete by: As directed    Notify physician for increase in vaginal discharge   Complete by: As directed    Notify physician for leaking of fluid   Complete by: As directed    Notify physician for pain or burning when urinating   Complete by: As directed    Notify physician for pelvic pressure (sudden increase)   Complete by: As directed    Notify physician for severe or continued nausea or vomiting   Complete by: As directed    Notify physician for sudden gushing of fluid from the vagina (with or without continued leaking)   Complete by: As directed    Notify physician for sudden, constant, or occasional abdominal pain   Complete by: As directed    Notify physician if baby moving less than usual   Complete by: As directed       Allergies as of 01/29/2021   No Known Allergies      Medication List     TAKE these medications    multivitamin-prenatal 27-0.8 MG Tabs tablet Take 1 tablet by mouth daily at 12 noon.   omeprazole 20 MG capsule Commonly known as: PRILOSEC Take 1 capsule (20 mg total) by mouth daily.         Total  time spent taking care of this patient: 40 minutes  Signed: Vena Austria 01/29/2021, 6:43 AM

## 2021-01-29 NOTE — OB Triage Note (Signed)
Discharge instructions, red flag labor precautions and hypertension in pregnancy discussed and pt verbalized understanding. Pt stable at time of discharge with husband. Follow up care reviewed.

## 2021-01-29 NOTE — Progress Notes (Signed)
Staebler at bedside to discuss lab results & plan of care. Plan is to monitor fetal tracing through the night with Q4 blood pressure checks per MD. Pt in agreement with plan.

## 2021-01-30 DIAGNOSIS — O479 False labor, unspecified: Principal | ICD-10-CM | POA: Insufficient documentation

## 2021-01-31 ENCOUNTER — Encounter: Payer: Self-pay | Admitting: Obstetrics and Gynecology

## 2021-01-31 ENCOUNTER — Observation Stay
Admission: EM | Admit: 2021-01-31 | Discharge: 2021-01-31 | Disposition: A | Discharge status: Home / Self Care | Payer: BC Managed Care – PPO | Attending: Obstetrics and Gynecology | Admitting: Obstetrics and Gynecology

## 2021-01-31 ENCOUNTER — Other Ambulatory Visit: Payer: Self-pay

## 2021-01-31 DIAGNOSIS — Z3A39 39 weeks gestation of pregnancy: Secondary | ICD-10-CM

## 2021-01-31 DIAGNOSIS — O479 False labor, unspecified: Secondary | ICD-10-CM | POA: Diagnosis present

## 2021-01-31 DIAGNOSIS — O26893 Other specified pregnancy related conditions, third trimester: Secondary | ICD-10-CM

## 2021-01-31 DIAGNOSIS — R109 Unspecified abdominal pain: Secondary | ICD-10-CM

## 2021-01-31 NOTE — OB Triage Note (Addendum)
Pt arrived to [facility] with complaints of ctx q5 min a part for the past day. Pt is a previous C/S with successful VBAC. Pt denies vaginal bleeding and LOF. Pt states positive FM. Monitors applied and assessing. Initial FHT 145.

## 2021-01-31 NOTE — Discharge Summary (Addendum)
Physician Discharge Summary  Patient ID: Kendra Hunter MRN: [id number] DOB/AGE: 23-Jan-1991 30 y.o.  Admit date: 01/31/2021 Discharge date: 01/31/2021  Admission Diagnoses:Uterine contractions   Discharge Diagnoses:  Active Problems:   Uterine contractions   Discharged Condition: good  Hospital Course: Patient has not made cervical change since her prior exam in the office. She reports that she has been feeling contractions all day roughly every 5 minutes. She has a history of a prior cesarean. She has had a successful VBAC in the past. She would like to attempt another TOLAC. She would like to plan an induction time since she has been uncomfortable with this last stage of pregnancy. Induction was originally scheduled for  02/03/2021. Was moved to 02/05/2021 for staffing.   Consults: None  Significant Diagnostic Studies: None  Treatments: None  Discharge Exam: Blood pressure (!) 125/93, pulse 88, temperature 98.2 F (36.8 C), temperature source Oral, resp. rate 14, last menstrual period 04/19/2020. General appearance: alert, cooperative, and appears stated age Extremities: extremities normal, atraumatic, no cyanosis or edema Skin: Skin color, texture, turgor normal. No rashes or lesions Neurologic: Alert and oriented X 3, normal strength and tone. Normal symmetric reflexes. Normal coordination and gait  Disposition: Discharge disposition: 01-Home or Self Care     Allergies as of 01/31/2021   No Known Allergies      Medication List     TAKE these medications    multivitamin-prenatal 27-0.8 MG Tabs tablet Take 1 tablet by mouth daily at 12 noon.   omeprazole 20 MG capsule Commonly known as: PRILOSEC Take 1 capsule (20 mg total) by mouth daily.        Follow-up Information     Sheyla Zaffino, Jaquelyn Bitter, MD. Schedule an appointment as soon as possible for a visit in 1 week(s).   Specialty: Obstetrics and Gynecology Contact information: [address] [place] [address] [phone]                 Signed: Natale Milch 01/31/2021, 1:18 AM

## 2021-01-31 NOTE — Discharge Instructions (Signed)

## 2021-01-31 NOTE — OB Triage Note (Signed)
Pt discharged home in stable condition. RN provided discharge instructions to pt, including information regarding labor precautions. RN reviewed TOLAC induction information with pt. Pt verbalized understanding and all questions answered at this time.

## 2021-02-02 ENCOUNTER — Encounter: Payer: BC Managed Care – PPO | Admitting: Obstetrics and Gynecology

## 2021-03-16 ENCOUNTER — Other Ambulatory Visit: Payer: Self-pay

## 2021-03-16 ENCOUNTER — Encounter: Payer: Self-pay | Admitting: Obstetrics and Gynecology

## 2021-03-16 ENCOUNTER — Ambulatory Visit (INDEPENDENT_AMBULATORY_CARE_PROVIDER_SITE_OTHER): Payer: BC Managed Care – PPO | Admitting: Obstetrics and Gynecology

## 2021-03-16 DIAGNOSIS — Z30430 Encounter for insertion of intrauterine contraceptive device: Secondary | ICD-10-CM

## 2021-03-16 MED ORDER — LEVONORGESTREL 20 MCG/DAY IU IUD: 1.0000 | INTRAUTERINE_SYSTEM | Freq: Once | INTRAUTERINE | 0 refills | Status: AC

## 2021-03-16 NOTE — Progress Notes (Signed)
Postpartum Visit  Chief Complaint:  Chief Complaint  Patient presents with   Postpartum Care    History of Present Illness: Patient is a 30 y.o. [id number] presents for postpartum visit.  Date of delivery: 01/31/2021 - patient delivered at [facility]  Type of delivery: Vaginal delivery after cesarean (VBAC) - Vacuum or forceps assisted  no Episiotomy No.  Laceration: no  Pregnancy or labor problems:  no Any problems since the delivery:  no   Newborn Details:  SINGLETON :  1. Baby's name: Mateo. Birth weight: 8.4lb Maternal Details:  Breast Feeding:  yes Post partum depression/anxiety noted:  no Edinburgh Post-Partum Depression Score:  1  Date of last PAP: 03/08/2020  normal   Past Medical History:  Diagnosis Date   Acute blood loss anemia 03/31/2013   Hypertension in pregnancy 03/29/2013   Postpartum care following cesarean delivery 03/31/2013   S/P primary low transverse C-section 03/31/2013    Past Surgical History:  Procedure Laterality Date   CESAREAN SECTION N/A 03/30/2013   Procedure: Primary Cesarean Section Delivery Baby boy @ 2321, Apgars 9/9;  Surgeon: Robley Fries, MD;  Location: [facility];  Service: Obstetrics;  Laterality: N/A;   CHOLECYSTECTOMY N/A    WISDOM TOOTH EXTRACTION      Prior to Admission medications: Denies     No Known Allergies   Social History   Socioeconomic History   Marital status: Married    Spouse name: De Nurse   Number of children: Not on file   Years of education: Not on file   Highest education level: Not on file  Occupational History   Not on file  Tobacco Use   Smoking status: Never   Smokeless tobacco: Never  Vaping Use   Vaping Use: Never used  Substance and Sexual Activity   Alcohol use: No   Drug use: No   Sexual activity: Yes    Birth control/protection: I.U.D.  Other Topics Concern   Not on file  Social History Narrative   Not on file   Social Determinants of Health   Financial Resource Strain: Not on  file  Food Insecurity: Not on file  Transportation Needs: Not on file  Physical Activity: Not on file  Stress: Not on file  Social Connections: Not on file  Intimate Partner Violence: Not on file    History reviewed. No pertinent family history.  Review of Systems  Constitutional: Negative.   HENT: Negative.    Eyes: Negative.   Respiratory: Negative.    Cardiovascular: Negative.   Gastrointestinal: Negative.   Genitourinary: Negative.   Musculoskeletal: Negative.   Skin: Negative.   Neurological: Negative.   Psychiatric/Behavioral: Negative.      Physical Exam BP 126/84   Ht 5\' 5"  (1.651 m)   Wt 237 lb (107.5 kg)   LMP 04/19/2020   Breastfeeding Yes   BMI 39.44 kg/m   Physical Exam Constitutional:      General: She is not in acute distress.    Appearance: Normal appearance. She is well-developed.  Genitourinary:     Vulva, bladder and urethral meatus normal.     Right Labia: No rash, tenderness, lesions, skin changes or Bartholin's cyst.    Left Labia: No tenderness, skin changes, Bartholin's cyst or rash.    No inguinal adenopathy present in the right or left side.    Pelvic Tanner Score: 5/5.     Right Adnexa: not tender, not full and no mass present.    Left Adnexa: not tender, not  full and no mass present.    No cervical motion tenderness, friability, lesion or polyp.     Uterus is not enlarged, fixed or tender.     Uterus is anteverted.     No urethral tenderness or mass present.     Pelvic exam was performed with patient in the lithotomy position.  HENT:     Head: Normocephalic and atraumatic.  Eyes:     General: No scleral icterus.    Conjunctiva/sclera: Conjunctivae normal.  Cardiovascular:     Rate and Rhythm: Normal rate and regular rhythm.     Heart sounds: No murmur heard.   No friction rub. No gallop.  Pulmonary:     Effort: Pulmonary effort is normal. No respiratory distress.     Breath sounds: Normal breath sounds. No wheezing or rales.   Abdominal:     General: Bowel sounds are normal. There is no distension.     Palpations: Abdomen is soft. There is no mass.     Tenderness: There is no abdominal tenderness. There is no guarding or rebound.     Hernia: There is no hernia in the left inguinal area or right inguinal area.  Musculoskeletal:        General: Normal range of motion.     Cervical back: Normal range of motion and neck supple.  Lymphadenopathy:     Lower Body: No right inguinal adenopathy. No left inguinal adenopathy.  Neurological:     General: No focal deficit present.     Mental Status: She is alert and oriented to person, place, and time.     Cranial Nerves: No cranial nerve deficit.  Skin:    General: Skin is warm and dry.     Findings: No erythema.  Psychiatric:        Mood and Affect: Mood normal.        Behavior: Behavior normal.        Judgment: Judgment normal.    IUD Insertion Procedure Note (Mirena)  Patient identified, informed consent performed, consent signed.   Discussed risks of irregular bleeding, cramping, infection, malpositioning, expulsion or uterine perforation of the IUD (1:1000 placements)  which may require further procedure such as laparoscopy.  IUD while effective at preventing pregnancy do not prevent transmission of sexually transmitted diseases and use of barrier methods for this purpose was discussed. Time out was performed.  Urine pregnancy test negative.  Speculum placed in the vagina.  Cervix visualized.  Cleaned with Betadine x 2.  Grasped anteriorly with a single tooth tenaculum.  Uterus sounded to 9 cm. IUD placed per manufacturer's recommendations.  Strings trimmed to 3 cm. Tenaculum was removed, good hemostasis noted.  Patient tolerated procedure well.   Patient was given post-procedure instructions.  She was advised to have backup contraception for one week.  Patient was also asked to check IUD strings periodically and follow up in 4 weeks for IUD check.  Female  Chaperone present during breast and/or pelvic exam.  Assessment: 30 y.o. [phone] presenting for 28 week postpartum visit  Plan: Problem List Items Addressed This Visit   None Visit Diagnoses     Postpartum care and examination    -  Primary   Relevant Medications   levonorgestrel (MIRENA) 20 MCG/DAY IUD   Encounter for IUD insertion       Relevant Medications   levonorgestrel (MIRENA) 20 MCG/DAY IUD      1) Contraception: IUD inserted today  2)  Pap - ASCCP guidelines and rational  discussed.  Patient opts for routine screening interval. She is up to date  3) Patient underwent screening for postpartum depression with not concerns noted.  Return in about 4 weeks (around 04/13/2021) for IUD String Check.   Thomasene Mohair, MD 03/16/2021 10:27 AM

## 2021-03-16 NOTE — Addendum Note (Signed)
Addended by: Thomasene Mohair D on: 03/16/2021 10:29 AM   Modules accepted: Level of Service

## 2021-04-18 ENCOUNTER — Ambulatory Visit: Payer: BC Managed Care – PPO | Admitting: Obstetrics and Gynecology

## 2022-02-13 ENCOUNTER — Ambulatory Visit
Admission: EM | Admit: 2022-02-13 | Discharge: 2022-02-13 | Disposition: A | Discharge status: Home / Self Care | Payer: BC Managed Care – PPO | Attending: Emergency Medicine | Admitting: Emergency Medicine

## 2022-02-13 ENCOUNTER — Ambulatory Visit (INDEPENDENT_AMBULATORY_CARE_PROVIDER_SITE_OTHER): Payer: BC Managed Care – PPO

## 2022-02-13 DIAGNOSIS — R490 Dysphonia: Secondary | ICD-10-CM | POA: Diagnosis not present

## 2022-02-13 DIAGNOSIS — W109XXA Fall (on) (from) unspecified stairs and steps, initial encounter: Secondary | ICD-10-CM

## 2022-02-13 DIAGNOSIS — R051 Acute cough: Secondary | ICD-10-CM | POA: Diagnosis not present

## 2022-02-13 DIAGNOSIS — M79671 Pain in right foot: Secondary | ICD-10-CM

## 2022-02-13 DIAGNOSIS — M25571 Pain in right ankle and joints of right foot: Secondary | ICD-10-CM

## 2022-02-13 DIAGNOSIS — B349 Viral infection, unspecified: Secondary | ICD-10-CM

## 2022-02-13 NOTE — Discharge Instructions (Signed)
URI/COLD SYMPTOMS: Your exam today is consistent with a viral illness. Antibiotics are not indicated at this time. Use medications as directed, including cough syrup, nasal saline, and decongestants. Your symptoms should improve over the next few days and resolve within another 7-10 days. Increase rest and fluids. F/u if symptoms worsen or predominate such as sore throat, ear pain, productive cough, shortness of breath, or if you develop high fevers or worsening fatigue over the next several days.   FOOT PAIN: Stressed avoiding painful activities . Reviewed RICE guidelines. Use medications as directed, including NSAIDs. If no NSAIDs have been prescribed for you today, you may take Aleve or Motrin over the counter. May use Tylenol in between doses of NSAIDs.  If no improvement in the next 1-2 weeks, f/u with PCP or [facility].

## 2022-02-13 NOTE — ED Triage Notes (Signed)
Patient presents to UC for loss of voice and coughing up green mucus -- started about a week ago and only getting worse.   Patient reports that she fell dow the stairs in June and has been having problems with her right foot.

## 2022-02-13 NOTE — ED Provider Notes (Signed)
[facility]    CSN: [id number] Arrival date & time: 02/13/22  0912      History   Chief Complaint Chief Complaint  Patient presents with   Cough   Foot Injury    Right     HPI Kendra Hunter is a 31 y.o. female presenting for approximately 1 week history of voice hoarseness, congestion and cough that is productive of thick greenish mucus.  Patient reports she feels like her symptoms of gotten worse.  She has not had any sinus pain, sore throat, chest pain or breathing difficulty.  Reports that her children are also sick with cough and congestion.  She has occasionally taken over-the-counter cough medicine for symptoms.  No history of asthma or lung issues.  Patient reports her other complaint is that she has had right foot pain off and on for the past 3 months.  Patient reports she fell a few months ago down some stairs.  Reports she never got checked out.  Pain got better.  She reports that she was only reminded of this problem when she was questioned by patient intake representative today if she had ever had a fall.  Patient she would just get her foot checked out while she was here for the other issue.  She denies any associated swelling.  She says it only hurts when she stands on it.  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  No numbness or weakness.  Not currently take any medication for symptoms.  HPI  Past Medical History:  Diagnosis Date   Acute blood loss anemia 03/31/2013   Hypertension in pregnancy 03/29/2013   Postpartum care following cesarean delivery 03/31/2013   S/P primary low transverse C-section 03/31/2013    Patient Active Problem List   Diagnosis Date Noted   Uterine contractions 01/31/2021   Labor and delivery indication for care or intervention 01/29/2021   Abdominal pain during pregnancy in third trimester 01/25/2021   Labor and delivery, indication for care 01/22/2021   History of cesarean delivery affecting pregnancy  11/15/2020   Maternal obesity, antepartum 06/15/2020   History of gestational hypertension 06/15/2020   Supervision of high risk pregnancy, antepartum 06/15/2020   History of successful vaginal birth after cesarean, currently pregnant 02/19/2016   Acute blood loss anemia 03/31/2013    Past Surgical History:  Procedure Laterality Date   CESAREAN SECTION N/A 03/30/2013   Procedure: Primary Cesarean Section Delivery Baby boy @ 2321, Apgars 9/9;  Surgeon: Robley Fries, MD;  Location: [facility];  Service: Obstetrics;  Laterality: N/A;   CHOLECYSTECTOMY N/A    WISDOM TOOTH EXTRACTION      OB History     Gravida  3   Para  3   Term  3   Preterm      AB      Living  3      SAB      IAB      Ectopic      Multiple  0   Live Births  3            Home Medications    Prior to Admission medications   Medication Sig Start Date End Date Taking? Authorizing Provider  levonorgestrel (MIRENA) 20 MCG/DAY IUD 1 each by Intrauterine route once for 1 dose. 03/16/21  Yes Conard Novak, MD  omeprazole (PRILOSEC) 20 MG capsule Take 1 capsule (20 mg total) by mouth daily. Patient not taking: Reported on  03/16/2021 06/15/20   Malachy Mood, MD  Prenatal Vit-Fe Fumarate-FA (MULTIVITAMIN-PRENATAL) 27-0.8 MG TABS tablet Take 1 tablet by mouth daily at 12 noon. Patient not taking: Reported on 03/16/2021    [provider]    Family History History reviewed. No pertinent family history.  Social History Social History   Tobacco Use   Smoking status: Never   Smokeless tobacco: Never  Vaping Use   Vaping Use: Never used  Substance Use Topics   Alcohol use: No   Drug use: No     Allergies   Patient has no known allergies.   Review of Systems Review of Systems  Constitutional:  Negative for chills, diaphoresis, fatigue and fever.  HENT:  Positive for congestion and voice change. Negative for ear pain, rhinorrhea, sinus pressure, sinus pain and sore  throat.   Respiratory:  Positive for cough. Negative for shortness of breath.   Cardiovascular:  Negative for chest pain.  Gastrointestinal:  Negative for abdominal pain, nausea and vomiting.  Musculoskeletal:  Positive for arthralgias. Negative for gait problem, joint swelling and myalgias.  Skin:  Negative for rash.  Neurological:  Negative for weakness and headaches.  Hematological:  Negative for adenopathy.     Physical Exam Triage Vital Signs ED Triage Vitals  Enc Vitals Group     BP 02/13/22 0943 117/86     Pulse Rate 02/13/22 0943 71     Resp --      Temp 02/13/22 0943 98.3 F (36.8 C)     Temp src --      SpO2 02/13/22 0943 100 %     Weight 02/13/22 0942 240 lb (108.9 kg)     Height 02/13/22 0942 5\' 5"  (1.651 m)     Head Circumference --      Peak Flow --      Pain Score 02/13/22 0941 3     Pain Loc --      Pain Edu? --      Excl. in [place]? --    No data found.  Updated Vital Signs BP 117/86 (BP Location: Left Arm)   Pulse 71   Temp 98.3 F (36.8 C)   Ht 5\' 5"  (1.651 m)   Wt 240 lb (108.9 kg)   SpO2 100%   Breastfeeding Unknown   BMI 39.94 kg/m      Physical Exam Vitals and nursing note reviewed.  Constitutional:      General: She is not in acute distress.    Appearance: Normal appearance. She is not ill-appearing or toxic-appearing.     Comments: Patient's voice is hoarse.  HENT:     Head: Normocephalic and atraumatic.     Nose: Congestion present.     Mouth/Throat:     Mouth: Mucous membranes are moist.     Pharynx: Oropharynx is clear.  Eyes:     General: No scleral icterus.       Right eye: No discharge.        Left eye: No discharge.     Conjunctiva/sclera: Conjunctivae normal.  Cardiovascular:     Rate and Rhythm: Normal rate and regular rhythm.     Heart sounds: Normal heart sounds.  Pulmonary:     Effort: Pulmonary effort is normal. No respiratory distress.     Breath sounds: Normal breath sounds. No wheezing, rhonchi or rales.   Musculoskeletal:     Cervical back: Neck supple.     Comments: Right foot: No swelling or ecchymosis of foot.  Faint  bruise left anterior ankle.  Mild tenderness palpation throughout the metatarsals.  Full range of motion of foot without any discomfort.  Good strength, pulses and cap refill.  Skin:    General: Skin is dry.  Neurological:     General: No focal deficit present.     Mental Status: She is alert. Mental status is at baseline.     Motor: No weakness.     Gait: Gait normal.  Psychiatric:        Mood and Affect: Mood normal.        Behavior: Behavior normal.        Thought Content: Thought content normal.      UC Treatments / Results  Labs (all labs ordered are listed, but only abnormal results are displayed) Labs Reviewed - No data to display  EKG   Radiology DG Foot Complete Right  Result Date: 02/13/2022 CLINICAL DATA:  Fall down the stairs in June, persistent pain in the dorsum of the foot EXAM: RIGHT FOOT COMPLETE - 3+ VIEW; RIGHT ANKLE - COMPLETE 3+ VIEW COMPARISON:  None Available. FINDINGS: There is no evidence of fracture or dislocation. There is no evidence of arthropathy or other focal bone abnormality. Soft tissues are unremarkable. IMPRESSION: No fracture or dislocation of the right foot or ankle. No radiographic findings to explain pain. Electronically Signed   By: Jearld Lesch M.D.   On: 02/13/2022 10:09   DG Ankle Complete Right  Result Date: 02/13/2022 CLINICAL DATA:  Fall down the stairs in June, persistent pain in the dorsum of the foot EXAM: RIGHT FOOT COMPLETE - 3+ VIEW; RIGHT ANKLE - COMPLETE 3+ VIEW COMPARISON:  None Available. FINDINGS: There is no evidence of fracture or dislocation. There is no evidence of arthropathy or other focal bone abnormality. Soft tissues are unremarkable. IMPRESSION: No fracture or dislocation of the right foot or ankle. No radiographic findings to explain pain. Electronically Signed   By: Jearld Lesch M.D.   On:  02/13/2022 10:09    Procedures Procedures (including critical care time)  Medications Ordered in UC Medications - No data to display  Initial Impression / Assessment and Plan / UC Course  I have reviewed the triage vital signs and the nursing notes.  Pertinent labs & imaging results that were available during my care of the patient were reviewed by me and considered in my medical decision making (see chart for details).   31 year old female presenting for 2 separate issues.  First complaint is cough, congestion and voice hoarseness for the past 1 week.  No fever or breathing difficulty.  Children are also sick with cough.  Other complaint is right-sided foot pain off and on for the past 3 months after a fall.  No swelling.  Pain is not severe.  Only after she has been standing for a long time, typically at work.  Vitals are normal and stable.  She is overall well-appearing but does have a hoarse voice.  On exam mild nasal congestion without drainage.  Throat is clear.  Chest clear to auscultation heart regular rate and rhythm.  There is no deformity or swelling of foot.  There is diffuse tenderness throughout the metatarsals.  Patient bruise to the anterior ankle.  X-ray of foot obtained as well as ankle.  X-rays are normal.  Discussed results with patient.  Suspect possible pain or sprain.  Advised of RICE guidelines, Tylenol Motrin.  If no improvement in the next couple of weeks or worsening symptoms to follow-up  with [facility].  Coughing congestion is consistent with viral illness.  Low suspicion for strep or pneumonia.  We will hold off on antibiotics at this time.  Offered her cough medication but she declined stating that she will take cough medicine she has at home.  Encouraged her to return if she is not feeling better over the next 1 to 2 weeks if she was fever, worsening cough or breathing difficulty.  Work note given for today.   Final Clinical Impressions(s) / UC Diagnoses    Final diagnoses:  Viral illness  Acute cough  Voice hoarseness  Right foot pain     Discharge Instructions      URI/COLD SYMPTOMS: Your exam today is consistent with a viral illness. Antibiotics are not indicated at this time. Use medications as directed, including cough syrup, nasal saline, and decongestants. Your symptoms should improve over the next few days and resolve within another 7-10 days. Increase rest and fluids. F/u if symptoms worsen or predominate such as sore throat, ear pain, productive cough, shortness of breath, or if you develop high fevers or worsening fatigue over the next several days.   FOOT PAIN: Stressed avoiding painful activities . Reviewed RICE guidelines. Use medications as directed, including NSAIDs. If no NSAIDs have been prescribed for you today, you may take Aleve or Motrin over the counter. May use Tylenol in between doses of NSAIDs.  If no improvement in the next 1-2 weeks, f/u with PCP or [facility].     ED Prescriptions   None    PDMP not reviewed this encounter.   Shirlee Latch, PA-C 02/13/22 1034

## 2022-02-14 IMAGING — US US OB COMP +14 WK
1 series · 15 of 28 positions shown · non-contrast
Comparison: none

CLINICAL DATA: Gestational hypertension and maternal obesity.
Evaluate fetal growth and amniotic fluid.

EXAM:
OBSTETRICAL ULTRASOUND >14 WKS

[Series 1: us ob follow up · 15 of 57 slices shown]
[im 1/57]
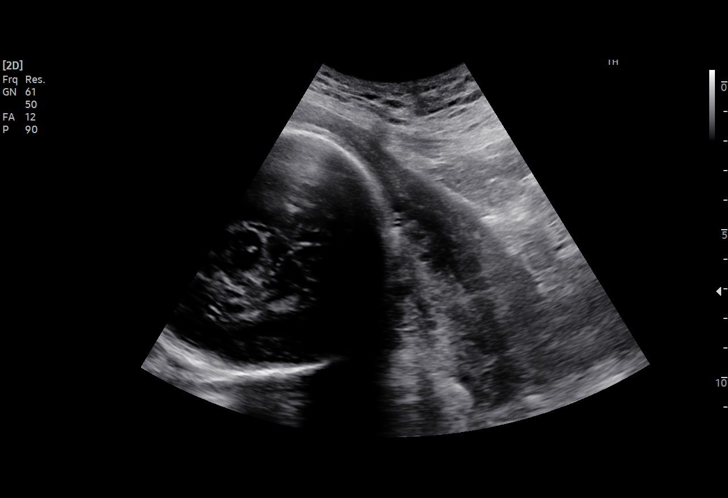
[im 5/57]
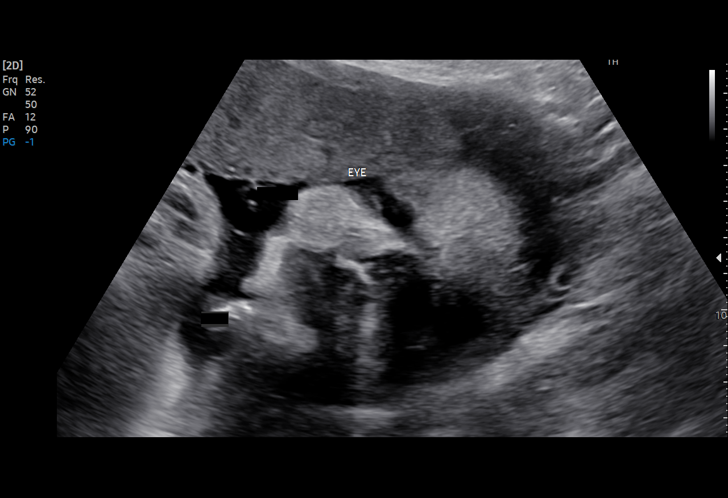
[im 9/57]
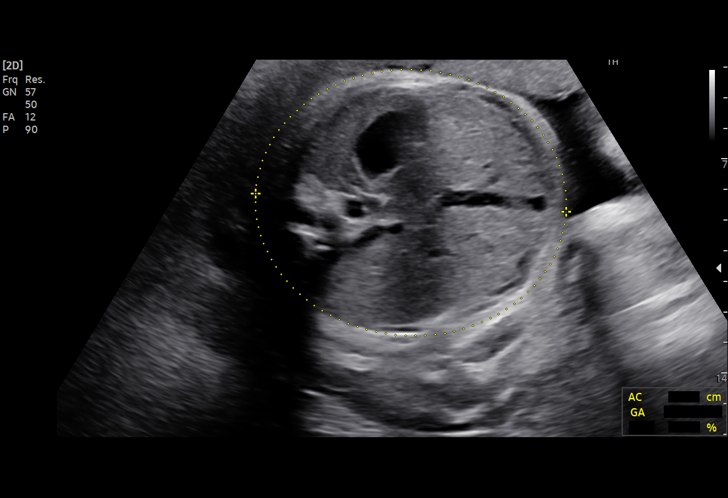
[im 13/57]
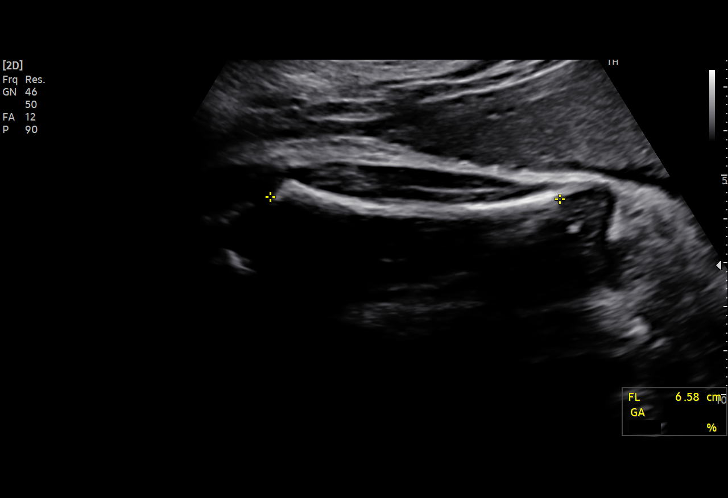
[im 17/57]
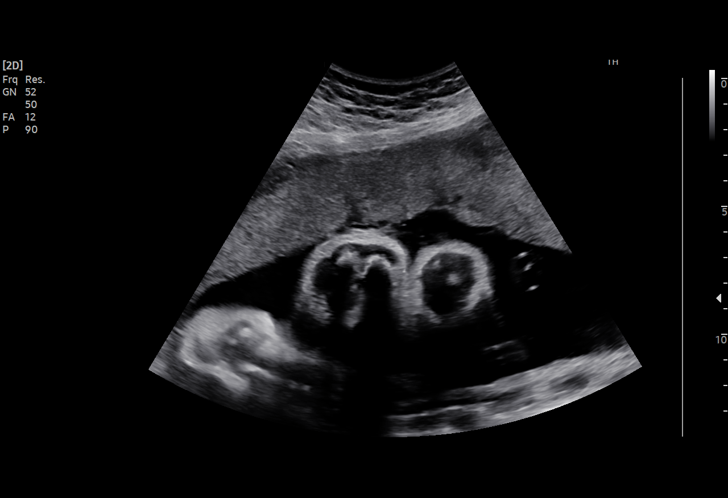
[im 21/57]
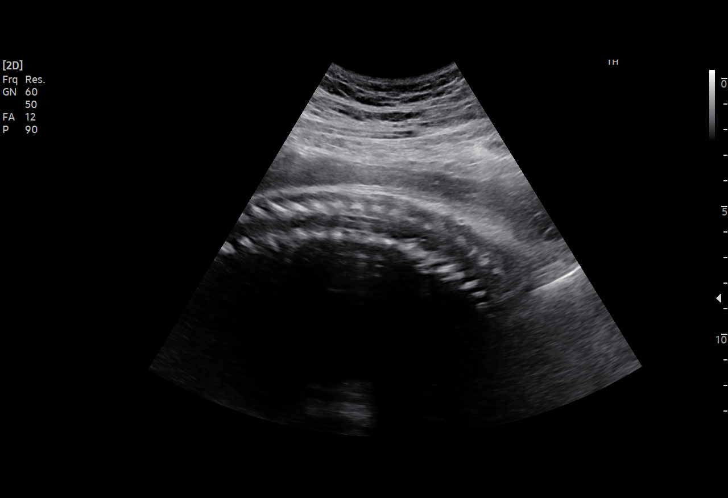
[im 25/57]
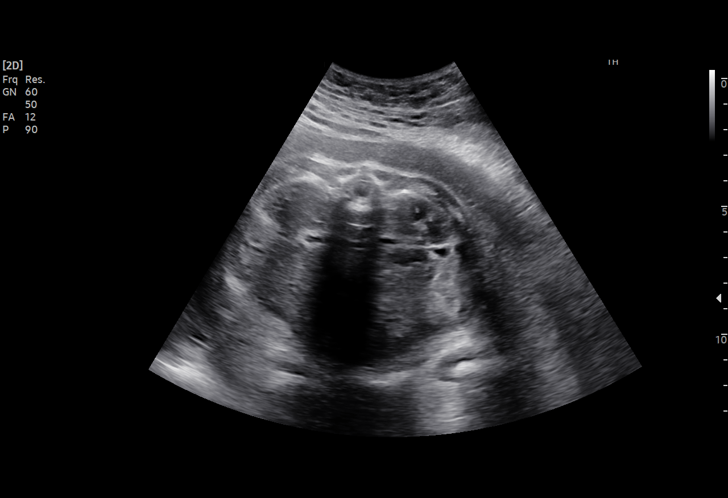
[im 30/57]
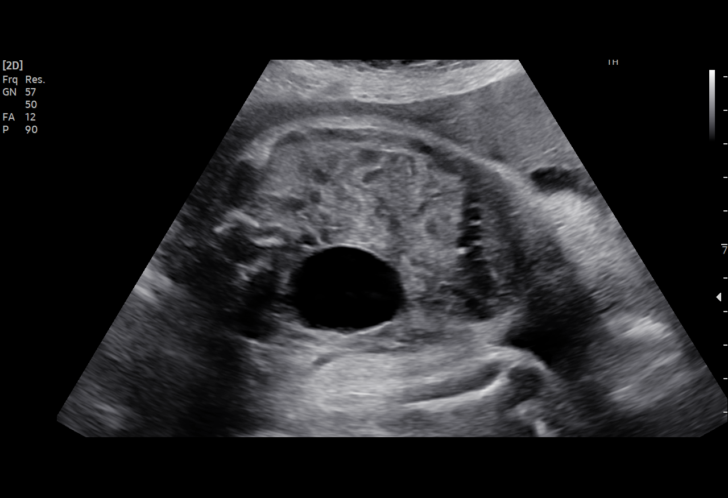
[im 32/57]
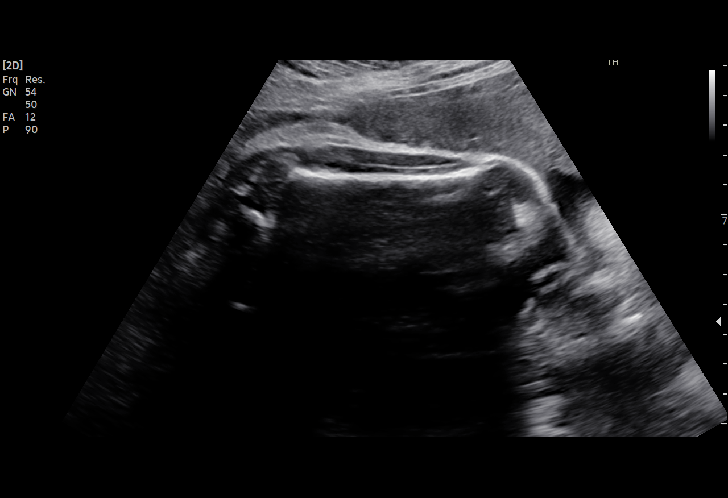
[im 36/57]
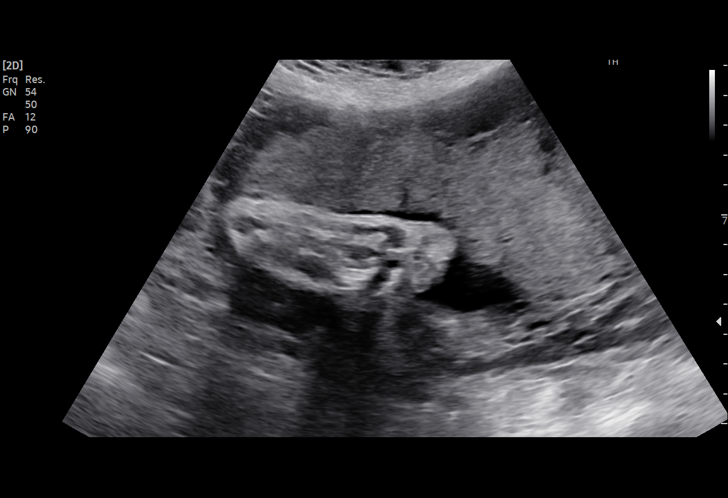
[im 40/57]
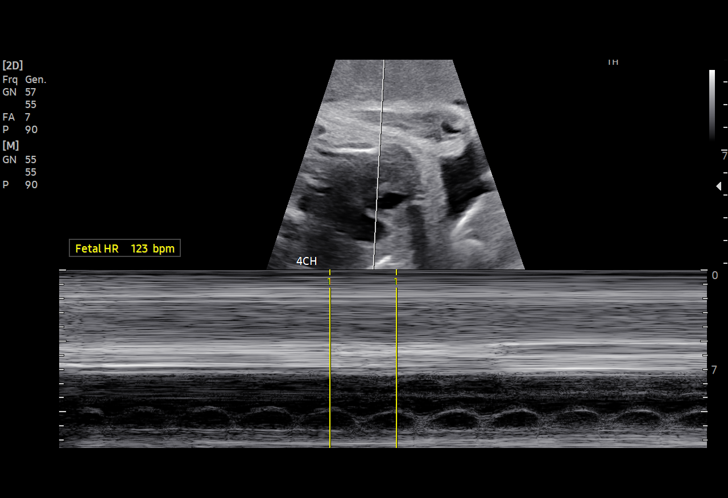
[im 44/57]
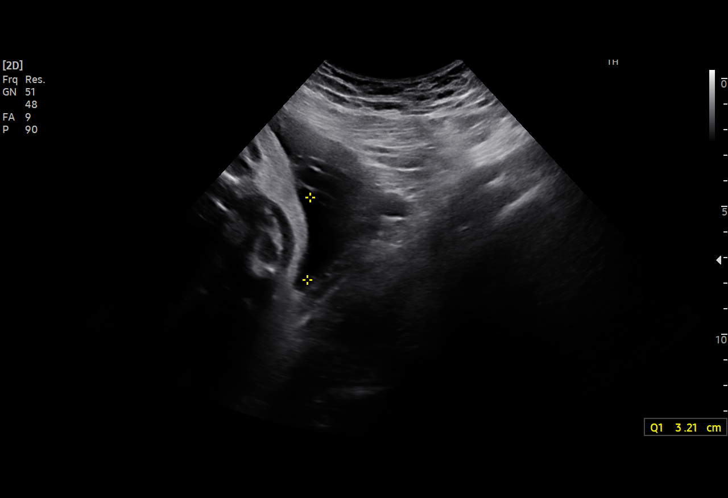
[im 48/57]
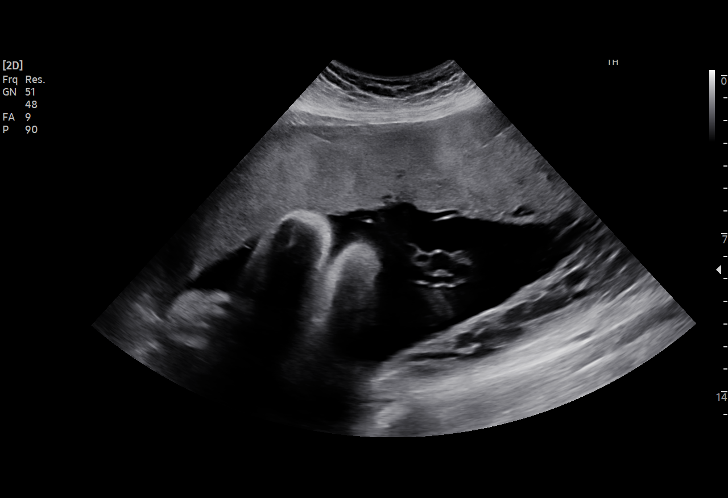
[im 52/57]
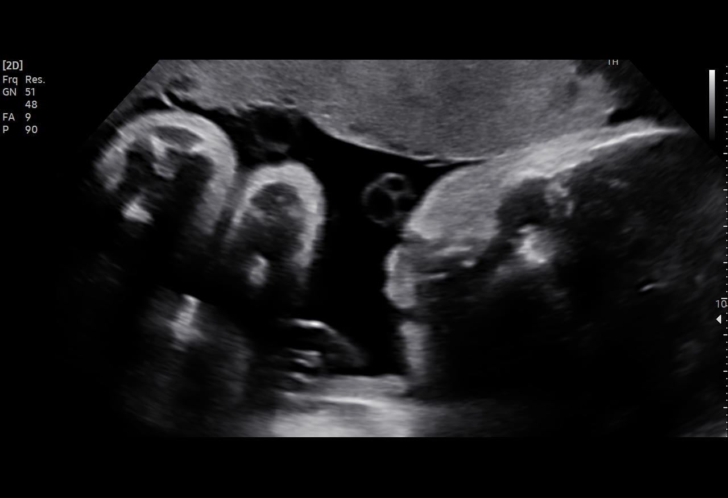
[im 57/57]
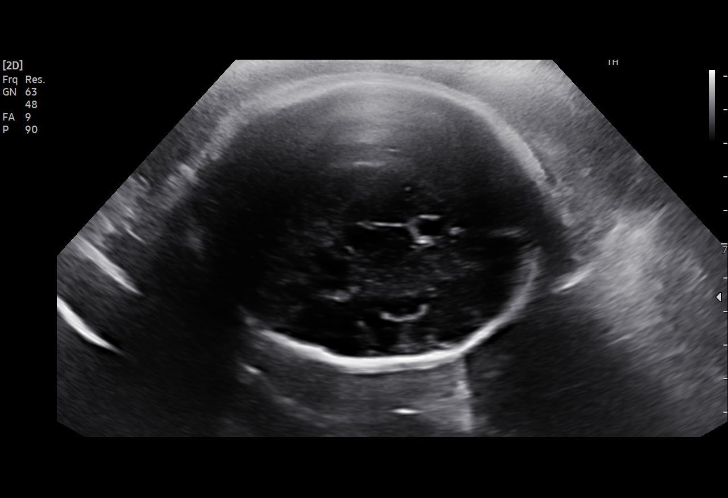

[15 of 28 positions shown; findings below may reference images not displayed]

FINDINGS: Number of Fetuses: 1

Heart Rate:  123 bpm

Movement: Yes

Presentation: Cephalic

Previa: No

Placental Location: Anterior

Amniotic Fluid (Subjective): Within normal limits

Amniotic Fluid (Objective):

AFI = 16.7 cm (5%ile= 8.1 cm, 95%= 24.8 cm for 34 wks)

FETAL BIOMETRY

BPD: 8.5cm 34w 2d

HC:   31.4cm 35w 1d

AC:   29.8cm 33w 5d

FL:   6.6cm 33w 6d

Current Mean GA: 34w 2d US EDC: 01/31/2021

Assigned GA:  34w 1d Assigned EDC: 02/01/2021

Estimated Fetal Weight:  2,317g 55%ile

FETAL ANATOMY

Lateral Ventricles: Appears normal

Thalami/CSP: Appears normal

Posterior Fossa:  Appears normal

Nuchal Region: Not visualized   NFT= N/A > 20 WKS

Upper Lip: Appears normal

Spine: Appears normal

4 Chamber Heart on Left: Appears normal

LVOT: Not visualized

RVOT: Not visualized

Stomach on Left: Appears normal

3 Vessel Cord: Appears normal

Cord Insertion site: Not visualized

Kidneys: Appears normal

Bladder: Appears normal

Extremities: Not visualized

Sex: Male

Technically difficult due to: Advanced gestational age and fetal
position

Maternal Findings:

Cervix:  Not evaluated (>34 wks)
IMPRESSION: Assigned GA currently 34 weeks 1 day. Appropriate fetal growth, with
EFW currently at 55 %ile.

No fetal anatomic abnormality seen involving visualized anatomy.

Amniotic fluid volume within normal limits, with AFI of 16.7 cm.
